# Patient Record
Sex: Male | Born: 1950 | Race: White | Hispanic: No | Marital: Married | Smoking: Never smoker
Health system: Southern US, Community
[De-identification: ages and names within clinical notes are randomized; demographics above are authoritative.]

## PROBLEM LIST (undated history)

## (undated) DIAGNOSIS — E7800 Pure hypercholesterolemia, unspecified: Secondary | ICD-10-CM

## (undated) DIAGNOSIS — I10 Essential (primary) hypertension: Secondary | ICD-10-CM

## (undated) DIAGNOSIS — K219 Gastro-esophageal reflux disease without esophagitis: Secondary | ICD-10-CM

## (undated) DIAGNOSIS — E119 Type 2 diabetes mellitus without complications: Secondary | ICD-10-CM

## (undated) DIAGNOSIS — R0600 Dyspnea, unspecified: Secondary | ICD-10-CM

## (undated) HISTORY — DX: Type 2 diabetes mellitus without complications: E11.9

## (undated) HISTORY — PX: COLONOSCOPY: SHX174

---

## 2015-07-08 DIAGNOSIS — E11319 Type 2 diabetes mellitus with unspecified diabetic retinopathy without macular edema: Secondary | ICD-10-CM | POA: Diagnosis not present

## 2015-07-20 DIAGNOSIS — Z0000 Encounter for general adult medical examination without abnormal findings: Secondary | ICD-10-CM | POA: Diagnosis not present

## 2015-07-20 DIAGNOSIS — E1165 Type 2 diabetes mellitus with hyperglycemia: Secondary | ICD-10-CM | POA: Diagnosis not present

## 2015-07-20 DIAGNOSIS — I10 Essential (primary) hypertension: Secondary | ICD-10-CM | POA: Diagnosis not present

## 2015-07-21 DIAGNOSIS — Z23 Encounter for immunization: Secondary | ICD-10-CM | POA: Diagnosis not present

## 2015-09-23 DIAGNOSIS — E1165 Type 2 diabetes mellitus with hyperglycemia: Secondary | ICD-10-CM | POA: Diagnosis not present

## 2015-09-23 DIAGNOSIS — Z1389 Encounter for screening for other disorder: Secondary | ICD-10-CM | POA: Diagnosis not present

## 2015-09-23 DIAGNOSIS — I10 Essential (primary) hypertension: Secondary | ICD-10-CM | POA: Diagnosis not present

## 2015-09-23 DIAGNOSIS — Z0000 Encounter for general adult medical examination without abnormal findings: Secondary | ICD-10-CM | POA: Diagnosis not present

## 2015-10-27 DIAGNOSIS — M810 Age-related osteoporosis without current pathological fracture: Secondary | ICD-10-CM | POA: Diagnosis not present

## 2015-10-27 DIAGNOSIS — Z79899 Other long term (current) drug therapy: Secondary | ICD-10-CM | POA: Diagnosis not present

## 2015-10-27 DIAGNOSIS — Z7984 Long term (current) use of oral hypoglycemic drugs: Secondary | ICD-10-CM | POA: Diagnosis not present

## 2015-10-27 DIAGNOSIS — I10 Essential (primary) hypertension: Secondary | ICD-10-CM | POA: Diagnosis not present

## 2015-10-27 DIAGNOSIS — E119 Type 2 diabetes mellitus without complications: Secondary | ICD-10-CM | POA: Diagnosis not present

## 2015-10-27 DIAGNOSIS — M8588 Other specified disorders of bone density and structure, other site: Secondary | ICD-10-CM | POA: Diagnosis not present

## 2015-11-23 DIAGNOSIS — R1319 Other dysphagia: Secondary | ICD-10-CM | POA: Diagnosis not present

## 2015-11-23 DIAGNOSIS — I10 Essential (primary) hypertension: Secondary | ICD-10-CM | POA: Diagnosis not present

## 2015-11-23 DIAGNOSIS — E1165 Type 2 diabetes mellitus with hyperglycemia: Secondary | ICD-10-CM | POA: Diagnosis not present

## 2015-12-29 DIAGNOSIS — Z79891 Long term (current) use of opiate analgesic: Secondary | ICD-10-CM | POA: Diagnosis not present

## 2015-12-29 DIAGNOSIS — M545 Low back pain: Secondary | ICD-10-CM | POA: Diagnosis not present

## 2016-01-31 DIAGNOSIS — I10 Essential (primary) hypertension: Secondary | ICD-10-CM | POA: Diagnosis not present

## 2016-01-31 DIAGNOSIS — E1165 Type 2 diabetes mellitus with hyperglycemia: Secondary | ICD-10-CM | POA: Diagnosis not present

## 2016-03-03 DIAGNOSIS — E1165 Type 2 diabetes mellitus with hyperglycemia: Secondary | ICD-10-CM | POA: Diagnosis not present

## 2016-03-03 DIAGNOSIS — I10 Essential (primary) hypertension: Secondary | ICD-10-CM | POA: Diagnosis not present

## 2016-04-03 DIAGNOSIS — E1165 Type 2 diabetes mellitus with hyperglycemia: Secondary | ICD-10-CM | POA: Diagnosis not present

## 2016-04-03 DIAGNOSIS — E784 Other hyperlipidemia: Secondary | ICD-10-CM | POA: Diagnosis not present

## 2016-04-03 DIAGNOSIS — I10 Essential (primary) hypertension: Secondary | ICD-10-CM | POA: Diagnosis not present

## 2016-05-29 DIAGNOSIS — H2512 Age-related nuclear cataract, left eye: Secondary | ICD-10-CM | POA: Diagnosis not present

## 2016-05-29 DIAGNOSIS — H25012 Cortical age-related cataract, left eye: Secondary | ICD-10-CM | POA: Diagnosis not present

## 2016-05-29 DIAGNOSIS — H35031 Hypertensive retinopathy, right eye: Secondary | ICD-10-CM | POA: Diagnosis not present

## 2016-05-29 DIAGNOSIS — Z961 Presence of intraocular lens: Secondary | ICD-10-CM | POA: Diagnosis not present

## 2016-05-29 DIAGNOSIS — E113291 Type 2 diabetes mellitus with mild nonproliferative diabetic retinopathy without macular edema, right eye: Secondary | ICD-10-CM | POA: Diagnosis not present

## 2016-06-02 DIAGNOSIS — E1165 Type 2 diabetes mellitus with hyperglycemia: Secondary | ICD-10-CM | POA: Diagnosis not present

## 2016-06-02 DIAGNOSIS — E784 Other hyperlipidemia: Secondary | ICD-10-CM | POA: Diagnosis not present

## 2016-06-02 DIAGNOSIS — I10 Essential (primary) hypertension: Secondary | ICD-10-CM | POA: Diagnosis not present

## 2016-06-13 DIAGNOSIS — H25012 Cortical age-related cataract, left eye: Secondary | ICD-10-CM | POA: Diagnosis not present

## 2016-06-13 DIAGNOSIS — H2512 Age-related nuclear cataract, left eye: Secondary | ICD-10-CM | POA: Diagnosis not present

## 2016-06-13 DIAGNOSIS — H2522 Age-related cataract, morgagnian type, left eye: Secondary | ICD-10-CM | POA: Diagnosis not present

## 2016-06-15 DIAGNOSIS — I10 Essential (primary) hypertension: Secondary | ICD-10-CM | POA: Diagnosis not present

## 2016-06-15 DIAGNOSIS — E1165 Type 2 diabetes mellitus with hyperglycemia: Secondary | ICD-10-CM | POA: Diagnosis not present

## 2016-08-17 DIAGNOSIS — E1165 Type 2 diabetes mellitus with hyperglycemia: Secondary | ICD-10-CM | POA: Diagnosis not present

## 2016-08-17 DIAGNOSIS — I10 Essential (primary) hypertension: Secondary | ICD-10-CM | POA: Diagnosis not present

## 2016-08-31 DIAGNOSIS — Z1389 Encounter for screening for other disorder: Secondary | ICD-10-CM | POA: Diagnosis not present

## 2016-08-31 DIAGNOSIS — E1165 Type 2 diabetes mellitus with hyperglycemia: Secondary | ICD-10-CM | POA: Diagnosis not present

## 2016-08-31 DIAGNOSIS — I10 Essential (primary) hypertension: Secondary | ICD-10-CM | POA: Diagnosis not present

## 2016-08-31 DIAGNOSIS — Z0000 Encounter for general adult medical examination without abnormal findings: Secondary | ICD-10-CM | POA: Diagnosis not present

## 2016-08-31 DIAGNOSIS — E784 Other hyperlipidemia: Secondary | ICD-10-CM | POA: Diagnosis not present

## 2016-09-14 DIAGNOSIS — I10 Essential (primary) hypertension: Secondary | ICD-10-CM | POA: Diagnosis not present

## 2016-09-14 DIAGNOSIS — E1165 Type 2 diabetes mellitus with hyperglycemia: Secondary | ICD-10-CM | POA: Diagnosis not present

## 2016-10-26 DIAGNOSIS — E1165 Type 2 diabetes mellitus with hyperglycemia: Secondary | ICD-10-CM | POA: Diagnosis not present

## 2016-10-26 DIAGNOSIS — I10 Essential (primary) hypertension: Secondary | ICD-10-CM | POA: Diagnosis not present

## 2016-10-31 DIAGNOSIS — Z0000 Encounter for general adult medical examination without abnormal findings: Secondary | ICD-10-CM | POA: Diagnosis not present

## 2016-10-31 DIAGNOSIS — E1165 Type 2 diabetes mellitus with hyperglycemia: Secondary | ICD-10-CM | POA: Diagnosis not present

## 2016-10-31 DIAGNOSIS — Z125 Encounter for screening for malignant neoplasm of prostate: Secondary | ICD-10-CM | POA: Diagnosis not present

## 2016-10-31 DIAGNOSIS — I10 Essential (primary) hypertension: Secondary | ICD-10-CM | POA: Diagnosis not present

## 2016-10-31 DIAGNOSIS — E784 Other hyperlipidemia: Secondary | ICD-10-CM | POA: Diagnosis not present

## 2016-11-16 DIAGNOSIS — I10 Essential (primary) hypertension: Secondary | ICD-10-CM | POA: Diagnosis not present

## 2016-11-16 DIAGNOSIS — E1165 Type 2 diabetes mellitus with hyperglycemia: Secondary | ICD-10-CM | POA: Diagnosis not present

## 2017-01-01 DIAGNOSIS — I10 Essential (primary) hypertension: Secondary | ICD-10-CM | POA: Diagnosis not present

## 2017-01-01 DIAGNOSIS — E784 Other hyperlipidemia: Secondary | ICD-10-CM | POA: Diagnosis not present

## 2017-01-01 DIAGNOSIS — M545 Low back pain: Secondary | ICD-10-CM | POA: Diagnosis not present

## 2017-01-01 DIAGNOSIS — E1165 Type 2 diabetes mellitus with hyperglycemia: Secondary | ICD-10-CM | POA: Diagnosis not present

## 2017-01-01 DIAGNOSIS — R1319 Other dysphagia: Secondary | ICD-10-CM | POA: Diagnosis not present

## 2017-01-08 ENCOUNTER — Encounter (INDEPENDENT_AMBULATORY_CARE_PROVIDER_SITE_OTHER): Payer: Self-pay | Admitting: Internal Medicine

## 2017-01-10 ENCOUNTER — Other Ambulatory Visit (INDEPENDENT_AMBULATORY_CARE_PROVIDER_SITE_OTHER): Payer: Self-pay | Admitting: Internal Medicine

## 2017-01-10 ENCOUNTER — Encounter (INDEPENDENT_AMBULATORY_CARE_PROVIDER_SITE_OTHER): Payer: Self-pay | Admitting: *Deleted

## 2017-01-10 ENCOUNTER — Ambulatory Visit (INDEPENDENT_AMBULATORY_CARE_PROVIDER_SITE_OTHER): Payer: Medicare Other | Admitting: Internal Medicine

## 2017-01-10 ENCOUNTER — Encounter (INDEPENDENT_AMBULATORY_CARE_PROVIDER_SITE_OTHER): Payer: Self-pay | Admitting: Internal Medicine

## 2017-01-10 ENCOUNTER — Encounter (INDEPENDENT_AMBULATORY_CARE_PROVIDER_SITE_OTHER): Payer: Self-pay

## 2017-01-10 VITALS — BP 140/80 | HR 64 | Temp 97.6°F | Ht 73.0 in | Wt 196.4 lb

## 2017-01-10 DIAGNOSIS — R1319 Other dysphagia: Secondary | ICD-10-CM | POA: Insufficient documentation

## 2017-01-10 DIAGNOSIS — R1310 Dysphagia, unspecified: Secondary | ICD-10-CM

## 2017-01-10 DIAGNOSIS — E119 Type 2 diabetes mellitus without complications: Secondary | ICD-10-CM | POA: Insufficient documentation

## 2017-01-10 HISTORY — DX: Type 2 diabetes mellitus without complications: E11.9

## 2017-01-10 MED ORDER — OMEPRAZOLE 40 MG PO CPDR: 40.0000 mg | DELAYED_RELEASE_CAPSULE | Freq: Every day | ORAL | 3 refills | Status: DC

## 2017-01-10 NOTE — Progress Notes (Signed)
   Subjective:    Patient ID: Dylan Shea, male    DOB: 1951-01-02, 66 y.o.   MRN: [id number]  HPI Referred by Dr. Sherrie Sport for dysphagia. When he drinks or eats he chokes on his food. He coughs frequently.  He has to chew his medicine up to be able to swallow.  Symptoms for a year and progressively worsened. He underwent and EGD/ED about 5 yrs ago by Dr. Britta Mccreedy in [place].  Appetite is good. Has lost weight due to his dysphagia (20 pounds). No abdominal pain. He has a BM daily. Occasionally has diarrhea after he eats.   diabetic since 1996  03/29/2011 Colonoscopy with snare polypectomy: Dr. Britta Mccreedy. Sessile polyp found in the proximal transverse colon. Removed in 2 pieces with snare.  Biopsy Tubular adenoma.   Review of Systems Past Medical History:  Diagnosis Date  . Diabetes ([place]) 01/10/2017    No past surgical history on file.  Not on File  No current outpatient prescriptions on file prior to visit.   No current facility-administered medications on file prior to visit.    Current Outpatient Prescriptions  Medication Sig Dispense Refill  . alendronate (FOSAMAX) 70 MG tablet Take 70 mg by mouth once a week. Take with a full glass of water on an empty stomach.    . empagliflozin (JARDIANCE) 25 MG TABS tablet Take 25 mg by mouth daily.    Marland Kitchen glimepiride (AMARYL) 4 MG tablet Take 4 mg by mouth daily with breakfast.    . metFORMIN (GLUCOPHAGE) 1000 MG tablet Take 1,000 mg by mouth 2 (two) times daily with a meal.    . simvastatin (ZOCOR) 20 MG tablet Take 20 mg by mouth daily.     No current facility-administered medications for this visit.         Objective:   Physical Exam Blood pressure 140/80, pulse 64, temperature 97.6 F (36.4 C), height 6\' 1"  (1.854 m), weight 196 lb 6.4 oz (89.1 kg). Alert and oriented. Skin warm and dry. Oral mucosa is moist.   . Sclera anicteric, conjunctivae is pink. Thyroid not enlarged. No cervical lymphadenopathy. Lungs clear. Heart regular rate  and rhythm.  Abdomen is soft. Bowel sounds are positive. No hepatomegaly. No abdominal masses felt. No tenderness.  No edema to lower extremities.  .         Assessment & Plan:  Dysphagia. EGD/ED. Esophageal stricture needs to be ruled out. Will get last EGD/ED from [facility].  Rx for Omeprazole 40mg  daily.

## 2017-01-10 NOTE — Patient Instructions (Signed)
EGD/ED

## 2017-01-12 ENCOUNTER — Encounter (INDEPENDENT_AMBULATORY_CARE_PROVIDER_SITE_OTHER): Payer: Self-pay

## 2017-01-25 DIAGNOSIS — E1165 Type 2 diabetes mellitus with hyperglycemia: Secondary | ICD-10-CM | POA: Diagnosis not present

## 2017-01-25 DIAGNOSIS — I10 Essential (primary) hypertension: Secondary | ICD-10-CM | POA: Diagnosis not present

## 2017-02-05 ENCOUNTER — Encounter (INDEPENDENT_AMBULATORY_CARE_PROVIDER_SITE_OTHER): Payer: Self-pay

## 2017-02-13 ENCOUNTER — Other Ambulatory Visit: Payer: Self-pay

## 2017-02-13 NOTE — Patient Outreach (Signed)
[facility]) Care Management  02/13/2017  Less Woolsey [birth year] [id number]    Medication Adherence call to  Mr. Jequan Shahin the reason for this call is because she is showing past due under [place] on her jardiance 25 mg patient said she needs the medication and if we can help her order the medication from [facility] call pharmacy and they will have it ready for her.    [place] Management Direct Dial [phone]  Fax [phone] [email or web address]

## 2017-02-14 ENCOUNTER — Other Ambulatory Visit: Payer: Self-pay

## 2017-02-14 NOTE — Patient Outreach (Signed)
[facility]) Care Management  02/14/2017  Dylan Shea 02-16-1951 [id number]   Telephone Screen  Referral Date: 02/13/17 Referral Source: Med Adherence Call Referral Reason: "Diabetes" Insurance: [facility] Medicare   Outreach attempt # 1 to patient. Spoke with patient and screening call completed.   Social: Patient resides in his home along with his spouse. He voices that he is independent with all ADLs/IADLs. He drives himself to medical appts. He denies any recent falls. No assistive devices currently being used.    Conditions: Patient voices that he only has medical issue of diabetes(since 1996). He voices that he is checking his blood sugars every other day. He reports cbgs normally range in the 100s-115s. Patient is scheduled for upcoming EGD to determine cause of his coughing and difficulty swallowing at times.    Medications: Patient voices taking less than 10 meds. Denies any issues with affording meds. He states that his spouse assists him with managing his meds.   Appointments: Patient reports he only sess PCP. Saw last in July and will see again next month.   Consent: [facility] services reviewed and discussed with patient. Patient gave verbal consent for services. Patient voices no RN CM care coordination needs. He is agreeable to [facility] for further diabetes management and education.      Plan: RN CM will notify [facility] administrative assistant of case status. RN CM will send [facility] referral for further diabetes education and management.   [name], RN,BSN,CCM [place] Management Telephonic Care Management Coordinator Direct Phone: [phone] Toll Free: [phone] Fax: [phone]

## 2017-03-12 DIAGNOSIS — E1165 Type 2 diabetes mellitus with hyperglycemia: Secondary | ICD-10-CM | POA: Diagnosis not present

## 2017-03-12 DIAGNOSIS — I10 Essential (primary) hypertension: Secondary | ICD-10-CM | POA: Diagnosis not present

## 2017-03-13 DIAGNOSIS — M545 Low back pain: Secondary | ICD-10-CM | POA: Diagnosis not present

## 2017-03-13 DIAGNOSIS — I10 Essential (primary) hypertension: Secondary | ICD-10-CM | POA: Diagnosis not present

## 2017-03-13 DIAGNOSIS — E1165 Type 2 diabetes mellitus with hyperglycemia: Secondary | ICD-10-CM | POA: Diagnosis not present

## 2017-03-13 DIAGNOSIS — E784 Other hyperlipidemia: Secondary | ICD-10-CM | POA: Diagnosis not present

## 2017-04-26 ENCOUNTER — Ambulatory Visit (HOSPITAL_COMMUNITY)
Admission: RE | Admit: 2017-04-26 | Discharge: 2017-04-26 | Disposition: A | Discharge status: Home / Self Care | Payer: Medicare Other | Source: Ambulatory Visit | Attending: Internal Medicine | Admitting: Internal Medicine

## 2017-04-26 ENCOUNTER — Encounter (HOSPITAL_COMMUNITY)
Admission: RE | Disposition: A | Discharge status: Home / Self Care | Payer: Self-pay | Source: Ambulatory Visit | Attending: Internal Medicine

## 2017-04-26 ENCOUNTER — Encounter (HOSPITAL_COMMUNITY): Payer: Self-pay | Admitting: *Deleted

## 2017-04-26 DIAGNOSIS — K449 Diaphragmatic hernia without obstruction or gangrene: Secondary | ICD-10-CM

## 2017-04-26 DIAGNOSIS — K219 Gastro-esophageal reflux disease without esophagitis: Secondary | ICD-10-CM | POA: Insufficient documentation

## 2017-04-26 DIAGNOSIS — E7800 Pure hypercholesterolemia, unspecified: Secondary | ICD-10-CM | POA: Diagnosis not present

## 2017-04-26 DIAGNOSIS — K228 Other specified diseases of esophagus: Secondary | ICD-10-CM | POA: Diagnosis not present

## 2017-04-26 DIAGNOSIS — Z79899 Other long term (current) drug therapy: Secondary | ICD-10-CM | POA: Diagnosis not present

## 2017-04-26 DIAGNOSIS — E119 Type 2 diabetes mellitus without complications: Secondary | ICD-10-CM | POA: Diagnosis not present

## 2017-04-26 DIAGNOSIS — R1310 Dysphagia, unspecified: Secondary | ICD-10-CM | POA: Insufficient documentation

## 2017-04-26 DIAGNOSIS — Z7984 Long term (current) use of oral hypoglycemic drugs: Secondary | ICD-10-CM | POA: Insufficient documentation

## 2017-04-26 DIAGNOSIS — R1314 Dysphagia, pharyngoesophageal phase: Secondary | ICD-10-CM | POA: Diagnosis not present

## 2017-04-26 DIAGNOSIS — I10 Essential (primary) hypertension: Secondary | ICD-10-CM | POA: Insufficient documentation

## 2017-04-26 DIAGNOSIS — R1319 Other dysphagia: Secondary | ICD-10-CM | POA: Insufficient documentation

## 2017-04-26 HISTORY — DX: Gastro-esophageal reflux disease without esophagitis: K21.9

## 2017-04-26 HISTORY — PX: ESOPHAGEAL DILATION: SHX303

## 2017-04-26 HISTORY — DX: Pure hypercholesterolemia, unspecified: E78.00

## 2017-04-26 HISTORY — DX: Essential (primary) hypertension: I10

## 2017-04-26 HISTORY — PX: ESOPHAGOGASTRODUODENOSCOPY: SHX5428

## 2017-04-26 LAB — GLUCOSE, CAPILLARY: GLUCOSE-CAPILLARY: 129 mg/dL — AB (ref 65–99)

## 2017-04-26 SURGERY — EGD (ESOPHAGOGASTRODUODENOSCOPY)
Anesthesia: Moderate Sedation

## 2017-04-26 MED ORDER — MEPERIDINE HCL 50 MG/ML IJ SOLN
INTRAMUSCULAR | Status: AC
  Filled 2017-04-26: qty 1

## 2017-04-26 MED ORDER — LIDOCAINE VISCOUS 2 % MT SOLN
OROMUCOSAL | Status: DC | PRN
  Administered 2017-04-26: 1 via OROMUCOSAL

## 2017-04-26 MED ORDER — SODIUM CHLORIDE 0.9 % IV SOLN
INTRAVENOUS | Status: DC
  Administered 2017-04-26: 08:00:00 via INTRAVENOUS

## 2017-04-26 MED ORDER — LIDOCAINE VISCOUS 2 % MT SOLN
OROMUCOSAL | Status: AC
  Filled 2017-04-26: qty 15

## 2017-04-26 MED ORDER — MIDAZOLAM HCL 5 MG/5ML IJ SOLN
INTRAMUSCULAR | Status: DC | PRN
  Administered 2017-04-26 (×2): 2 mg via INTRAVENOUS
  Administered 2017-04-26: 1 mg via INTRAVENOUS
  Administered 2017-04-26 (×2): 2 mg via INTRAVENOUS
  Administered 2017-04-26: 1 mg via INTRAVENOUS

## 2017-04-26 MED ORDER — MEPERIDINE HCL 50 MG/ML IJ SOLN
INTRAMUSCULAR | Status: DC | PRN
Start: 2017-04-26 — End: 2017-04-26
  Administered 2017-04-26 (×2): 25 mg via INTRAVENOUS

## 2017-04-26 MED ORDER — MIDAZOLAM HCL 5 MG/5ML IJ SOLN
INTRAMUSCULAR | Status: AC
  Filled 2017-04-26: qty 10

## 2017-04-26 NOTE — Discharge Instructions (Signed)
Resume usual medications and diet as before. No driving for 24 hours. Please call office with progress report in one week.   Upper Endoscopy, Care After Refer to this sheet in the next few weeks. These instructions provide you with information about caring for yourself after your procedure. Your health care provider may also give you more specific instructions. Your treatment has been planned according to current medical practices, but problems sometimes occur. Call your health care provider if you have any problems or questions after your procedure. What can I expect after the procedure? After the procedure, it is common to have:  A sore throat.  Bloating.  Nausea.  Follow these instructions at home:  Follow instructions from your health care provider about what to eat or drink after your procedure.  Return to your normal activities as told by your health care provider. Ask your health care provider what activities are safe for you.  Take over-the-counter and prescription medicines only as told by your health care provider.  Do not drive for 24 hours if you received a sedative.  Keep all follow-up visits as told by your health care provider. This is important. Contact a health care provider if:  You have a sore throat that lasts longer than one day.  You have trouble swallowing. Get help right away if:  You have a fever.  You vomit blood or your vomit looks like coffee grounds.  You have bloody, black, or tarry stools.  You have a severe sore throat or you cannot swallow.  You have difficulty breathing.  You have severe pain in your chest or belly. This information is not intended to replace advice given to you by your health care provider. Make sure you discuss any questions you have with your health care provider. Document Released: 12/19/2011 Document Revised: 11/25/2015 Document Reviewed: 04/01/2015 Elsevier Interactive Patient Education  2017 [employer].

## 2017-04-26 NOTE — H&P (Signed)
Dylan Shea is an 66 y.o. male.   Chief Complaint: Patient is here for EGD and ED. HPI: Patient is 66 year old Caucasian male with chronic GERD and presents with several month history of solid food dysphagia. He states dysphagia started several range years ago. EGD with dilation by Dr. Britta Mccreedy in October 2012. He states it helped for several months. He points to suprasternal area soft bolus obstruction. He says heartburns well controlled. He's had multiple episodes of food impaction relieved with regurgitation. About a year ago his daughter who is not in performed Heimlich maneuver for relief. States he has lost few pounds which he believes is due to dysphagia.  Past Medical History:  Diagnosis Date  . Diabetes ([place]) 01/10/2017  . GERD (gastroesophageal reflux disease)   . Hypercholesteremia   . Hypertension     Past Surgical History:  Procedure Laterality Date  . COLONOSCOPY      History reviewed. No pertinent family history. Social History:  reports that he has never smoked. He has never used smokeless tobacco. He reports that he does not drink alcohol or use drugs.  Allergies: No Known Allergies  Medications Prior to Admission  Medication Sig Dispense Refill  . empagliflozin (JARDIANCE) 25 MG TABS tablet Take 25 mg by mouth daily.    Marland Kitchen glimepiride (AMARYL) 4 MG tablet Take 4 mg by mouth daily with breakfast.    . metFORMIN (GLUCOPHAGE) 1000 MG tablet Take 1,000 mg by mouth 2 (two) times daily with a meal.    . omeprazole (PRILOSEC) 40 MG capsule Take 1 capsule (40 mg total) by mouth daily. 30 capsule 3  . oxyCODONE-acetaminophen (PERCOCET/ROXICET) 5-325 MG tablet Take 1 tablet by mouth 3 (three) times daily as needed for moderate pain.   0  . quinapril-hydrochlorothiazide (ACCURETIC) 20-12.5 MG tablet Take 1 tablet by mouth daily.  0  . simvastatin (ZOCOR) 20 MG tablet Take 20 mg by mouth daily.      Results for orders placed or performed during the hospital encounter of  04/26/17 (from the past 48 hour(s))  Glucose, capillary     Status: Abnormal   Collection Time: 04/26/17  7:45 AM  Result Value Ref Range   Glucose-Capillary 129 (H) 65 - 99 mg/dL   No results found.  ROS  Blood pressure (!) 164/95, pulse 62, temperature 97.8 F (36.6 C), temperature source Oral, resp. rate 15, height 6\' 1"  (1.854 m), weight 200 lb (90.7 kg), SpO2 100 %. Physical Exam  Constitutional: He appears well-developed and well-nourished.  HENT:  Mouth/Throat: Oropharynx is clear and moist.  Eyes: Conjunctivae are normal. No scleral icterus.  Neck: No thyromegaly present.  Cardiovascular: Normal rate, regular rhythm and normal heart sounds.   No murmur heard. Respiratory: Effort normal and breath sounds normal.  GI: Soft. He exhibits no distension and no mass. There is no tenderness.  Musculoskeletal: He exhibits no edema.  Lymphadenopathy:    He has no cervical adenopathy.  Neurological: He is alert.  Skin: Skin is warm and dry.     Assessment/Plan Solid food dysphagia in patient with chronic GERD. EGD with ED.  Hildred Laser, MD 04/26/2017, 8:57 AM

## 2017-04-26 NOTE — Op Note (Signed)
[facility] Patient Name: Dylan Shea Procedure Date: 04/26/2017 8:47 AM MRN: [id number] Date of Birth: 1950/08/21 Attending MD: Hildred Laser , MD CSN: [id number] Age: 66 Admit Type: Outpatient Procedure:                Upper GI endoscopy Indications:              Esophageal dysphagia, Gastro-esophageal reflux                            disease Providers:                Hildred Laser, MD, Lurline Del, RN, Zoila Shutter,                            Technologist Referring MD:             Stoney Bang MD, MD Medicines:                Lidocaine spray, Meperidine 50 mg IV, Midazolam 10                            mg IV Complications:            No immediate complications. Estimated Blood Loss:     Estimated blood loss: none. Procedure:                Pre-Anesthesia Assessment:                           - Prior to the procedure, a History and Physical                            was performed, and patient medications and                            allergies were reviewed. The patient's tolerance of                            previous anesthesia was also reviewed. The risks                            and benefits of the procedure and the sedation                            options and risks were discussed with the patient.                            All questions were answered, and informed consent                            was obtained. Prior Anticoagulants: The patient has                            taken no previous anticoagulant or antiplatelet                            agents. ASA Grade  Assessment: II - A patient with                            mild systemic disease. After reviewing the risks                            and benefits, the patient was deemed in                            satisfactory condition to undergo the procedure.                           After obtaining informed consent, the endoscope was                            passed under direct vision. Throughout the                             procedure, the patient's blood pressure, pulse, and                            oxygen saturations were monitored continuously. The                            [id number]-[id number] ([id number]) scope was introduced through the                            mouth, and advanced to the second part of duodenum.                            The upper GI endoscopy was accomplished without                            difficulty. The patient tolerated the procedure                            well. Scope In: 9:07:08 AM Scope Out: 9:19:54 AM Total Procedure Duration: 0 hours 12 minutes 46 seconds  Findings:      The examined esophagus was normal.      The Z-line was irregular and was found 42 cm from the incisors.      A 2 cm hiatal hernia was present.      No endoscopic abnormality was evident in the esophagus to explain the       patient's complaint of dysphagia. It was decided, however, to proceed       with dilation of the entire esophagus. The scope was withdrawn. Dilation       was performed with a Maloney dilator with no resistance at 56 Fr. The       dilation site was examined following endoscope reinsertion and showed no       change and no bleeding, mucosal tear or perforation.      The entire examined stomach was normal.      The duodenal bulb and second portion of the duodenum were normal. Impression:               -  Normal esophagus.                           - Z-line irregular, 42 cm from the incisors.                           - 2 cm hiatal hernia.                           - No endoscopic esophageal abnormality to explain                            patient's dysphagia. Esophagus dilated. Dilated.                           - Normal stomach.                           - Normal duodenal bulb and second portion of the                            duodenum.                           - No specimens collected. Moderate Sedation:      Moderate (conscious) sedation was administered by  the endoscopy nurse       and supervised by the endoscopist. The following parameters were       monitored: oxygen saturation, heart rate, blood pressure, CO2       capnography and response to care. Total physician intraservice time was       17 minutes. Recommendation:           - Patient has a contact number available for                            emergencies. The signs and symptoms of potential                            delayed complications were discussed with the                            patient. Return to normal activities tomorrow.                            Written discharge instructions were provided to the                            patient.                           - Resume previous diet today.                           - Continue present medications.                           - Telephone GI clinic in 1 week. Procedure  Code(s):        --- Professional ---                           [phone], Esophagogastroduodenoscopy, flexible,                            transoral; diagnostic, including collection of                            specimen(s) by brushing or washing, when performed                            (separate procedure)                           43450, Dilation of esophagus, by unguided sound or                            bougie, single or multiple passes                           99152, Moderate sedation services provided by the                            same physician or other qualified health care                            professional performing the diagnostic or                            therapeutic service that the sedation supports,                            requiring the presence of an independent trained                            observer to assist in the monitoring of the                            patient's level of consciousness and physiological                            status; initial 15 minutes of intraservice time,                             patient age 86 years or older Diagnosis Code(s):        --- Professional ---                           K22.8, Other specified diseases of esophagus                           K44.9, Diaphragmatic hernia without obstruction or  gangrene                           R13.14, Dysphagia, pharyngoesophageal phase                           K21.9, Gastro-esophageal reflux disease without                            esophagitis CPT copyright 2016 American Medical Association. All rights reserved. The codes documented in this report are preliminary and upon coder review may  be revised to meet current compliance requirements. Hildred Laser, MD Hildred Laser, MD 04/26/2017 9:28:29 AM This report has been signed electronically. Number of Addenda: 0

## 2017-04-30 ENCOUNTER — Encounter (HOSPITAL_COMMUNITY): Payer: Self-pay | Admitting: Internal Medicine

## 2017-04-30 DIAGNOSIS — E1165 Type 2 diabetes mellitus with hyperglycemia: Secondary | ICD-10-CM | POA: Diagnosis not present

## 2017-04-30 DIAGNOSIS — M545 Low back pain: Secondary | ICD-10-CM | POA: Diagnosis not present

## 2017-04-30 DIAGNOSIS — I10 Essential (primary) hypertension: Secondary | ICD-10-CM | POA: Diagnosis not present

## 2017-04-30 DIAGNOSIS — E7849 Other hyperlipidemia: Secondary | ICD-10-CM | POA: Diagnosis not present

## 2017-05-01 ENCOUNTER — Other Ambulatory Visit: Payer: Self-pay | Admitting: *Deleted

## 2017-05-01 NOTE — Patient Outreach (Signed)
[facility]) Care Management  05/01/2017   Dylan Shea 1951-02-04 [id number]  RN Health Coach telephone call to patient.  [place] compliance verified. Per patient he is doing well. His fasting blood sugar was 129 yesterday. Per patient he checks it every other day. Patient stated he has had low blood sugar episodes that probably occurs maybe every 3 months. Per patient he feels nervous and jittery when this happens. Per patient he eats some candy. Patient doesn't have symptoms when blood sugar is elevated that he knows of.  Patient stated that his wife keeps  up with his blood sugar. . Per patient he has not had any falls. He is independent and goes out on his boat and still drives. Patient is able to get to his Dr appointments. Per patient he normally does not eat breakfast. Patient stated he eats no cookies or sweets like that. Patient has agreed to follow up outreach calls.  Current Medications:  Current Outpatient Prescriptions  Medication Sig Dispense Refill  . empagliflozin (JARDIANCE) 25 MG TABS tablet Take 25 mg by mouth daily.    Marland Kitchen glimepiride (AMARYL) 4 MG tablet Take 4 mg by mouth daily with breakfast.    . metFORMIN (GLUCOPHAGE) 1000 MG tablet Take 1,000 mg by mouth 2 (two) times daily with a meal.    . omeprazole (PRILOSEC) 40 MG capsule Take 1 capsule (40 mg total) by mouth daily. 30 capsule 3  . oxyCODONE-acetaminophen (PERCOCET/ROXICET) 5-325 MG tablet Take 1 tablet by mouth 3 (three) times daily as needed for moderate pain.   0  . quinapril-hydrochlorothiazide (ACCURETIC) 20-12.5 MG tablet Take 1 tablet by mouth daily.  0  . simvastatin (ZOCOR) 20 MG tablet Take 20 mg by mouth daily.     No current facility-administered medications for this visit.     Functional Status:  In your present state of health, do you have any difficulty performing the following activities: 05/01/2017  Hearing? N  Vision? N  Difficulty concentrating or making decisions? N   Walking or climbing stairs? N  Dressing or bathing? N  Preparing Food and eating ? N  Using the Toilet? N  In the past six months, have you accidently leaked urine? N  Do you have problems with loss of bowel control? N  Managing your Medications? N  Managing your Finances? N  Housekeeping or managing your Housekeeping? N  Some recent data might be hidden    Fall/Depression Screening: Fall Risk  05/01/2017 02/14/2017  Falls in the past year? No No   PHQ 2/9 Scores 05/01/2017 02/14/2017  PHQ - 2 Score 0 0   THN CM Care Plan Problem One     Most Recent Value  Care Plan Problem One  Knowledge Deficit in self management of diabetes  Role Documenting the Problem One  [place] for Problem One  Active  THN Long Term Goal   Patient will have a better knowledge understanding of diabetes care within the next 90 days  THN Long Term Goal Start Date  05/01/17  Interventions for Problem One Long Term Goal  RN sent patient living well with diabetes booklet. RN discussed A1C. RN discussed hypo and hyperglycemia. RN will follow up with discussion and teachback.  THN CM Short Term Goal #1   Patient will understand the signs and symptoms of hypo and hyperglycemia within the next 30 days  THN CM Short Term Goal #1 Start Date  05/01/17  Interventions for Short Term Goal #1  RN sent picture chart of hypo and hyperglycemia. RN will follow up with discussion and teachback  THN CM Short Term Goal #2   Patient will report documenting blood sugars within the next 30 days  THN CM Short Term Goal #2 Start Date  05/01/17  Interventions for Short Term Goal #2  RN sent patient an 43 month calendar book for documentation and recording of blood sugars and signs and symptoms when hypo or hyperglycemia occurs. RN will follow up with  discussion and teach back.       Assessment:  Patient fasting blood sugar yesterday was 129 Patient checks blood sugars every other day Patient know his symptoms of  hypoglycemia but none noted for hyperglycemia Patient has not had any recent falls Patient will benefit from [facility] telephonic outreach for education and support for diabetes self management.   Plan: RN sent living well with Diabetes book RN sent a calendar book for documentation RN discussed hypo and hyperglycemia symptoms RN discussed A1C RN will follow up within the month of November RN sent a barriers letter and assessment to [facility] [phone]

## 2017-05-03 DIAGNOSIS — E1165 Type 2 diabetes mellitus with hyperglycemia: Secondary | ICD-10-CM | POA: Diagnosis not present

## 2017-05-03 DIAGNOSIS — M545 Low back pain: Secondary | ICD-10-CM | POA: Diagnosis not present

## 2017-05-03 DIAGNOSIS — I10 Essential (primary) hypertension: Secondary | ICD-10-CM | POA: Diagnosis not present

## 2017-05-03 DIAGNOSIS — E7849 Other hyperlipidemia: Secondary | ICD-10-CM | POA: Diagnosis not present

## 2017-05-09 ENCOUNTER — Telehealth (INDEPENDENT_AMBULATORY_CARE_PROVIDER_SITE_OTHER): Payer: Self-pay | Admitting: Internal Medicine

## 2017-05-09 NOTE — Telephone Encounter (Signed)
Patient presented to the office today with a progress report.  The patient states that he is a little better, but not a lot.  [phone] - spouse Rosann Auerbach

## 2017-05-14 DIAGNOSIS — E1165 Type 2 diabetes mellitus with hyperglycemia: Secondary | ICD-10-CM | POA: Diagnosis not present

## 2017-05-14 DIAGNOSIS — M545 Low back pain: Secondary | ICD-10-CM | POA: Diagnosis not present

## 2017-05-14 DIAGNOSIS — I10 Essential (primary) hypertension: Secondary | ICD-10-CM | POA: Diagnosis not present

## 2017-05-14 DIAGNOSIS — E7849 Other hyperlipidemia: Secondary | ICD-10-CM | POA: Diagnosis not present

## 2017-05-16 DIAGNOSIS — M545 Low back pain: Secondary | ICD-10-CM | POA: Diagnosis not present

## 2017-05-16 DIAGNOSIS — E1165 Type 2 diabetes mellitus with hyperglycemia: Secondary | ICD-10-CM | POA: Diagnosis not present

## 2017-05-16 DIAGNOSIS — I10 Essential (primary) hypertension: Secondary | ICD-10-CM | POA: Diagnosis not present

## 2017-05-16 DIAGNOSIS — E7849 Other hyperlipidemia: Secondary | ICD-10-CM | POA: Diagnosis not present

## 2017-05-18 NOTE — Telephone Encounter (Signed)
Per Dr.Rehman the patient will need to have a Barium Swallow with Pill.

## 2017-05-18 NOTE — Telephone Encounter (Signed)
This was reviewed with Dr.Rehman.

## 2017-05-21 ENCOUNTER — Other Ambulatory Visit (INDEPENDENT_AMBULATORY_CARE_PROVIDER_SITE_OTHER): Payer: Self-pay | Admitting: Internal Medicine

## 2017-05-21 DIAGNOSIS — R1310 Dysphagia, unspecified: Secondary | ICD-10-CM

## 2017-05-21 NOTE — Telephone Encounter (Signed)
BPE sch'd 05/28/17 at 930 (915), npo 4 hrs prior --  Left detailed message for patient's wife Rosann Auerbach

## 2017-05-28 ENCOUNTER — Ambulatory Visit (HOSPITAL_COMMUNITY): Payer: Medicare Other

## 2017-05-28 ENCOUNTER — Other Ambulatory Visit: Payer: Self-pay | Admitting: *Deleted

## 2017-05-28 NOTE — Patient Outreach (Signed)
Dylan Shea) Care Management  05/28/2017   Dylan Shea 1950-08-06 [id number]  RN Health Coach telephone call to patient.  Hipaa compliance verified. Per patient his fasting blood sugar was 127. Patient is taking his medications as prescribed. Patient is trying to adhere to his diet. Patient reported receiving the educational material RN Health Coach sent. RN went over the the symptoms of heart attack and stroke signs with patient and the fast action plan. Patient stated he had an eye exam done by the [employer] that came to his house. Patient has agreed to follow up outreach calls.      Current Medications:  Current Outpatient Medications  Medication Sig Dispense Refill  . empagliflozin (JARDIANCE) 25 MG TABS tablet Take 25 mg by mouth daily.    Dylan Shea glimepiride (AMARYL) 4 MG tablet Take 4 mg by mouth daily with breakfast.    . metFORMIN (GLUCOPHAGE) 1000 MG tablet Take 1,000 mg by mouth 2 (two) times daily with a meal.    . omeprazole (PRILOSEC) 40 MG capsule Take 1 capsule (40 mg total) by mouth daily. 30 capsule 3  . oxyCODONE-acetaminophen (PERCOCET/ROXICET) 5-325 MG tablet Take 1 tablet by mouth 3 (three) times daily as needed for moderate pain.   0  . quinapril-hydrochlorothiazide (ACCURETIC) 20-12.5 MG tablet Take 1 tablet by mouth daily.  0  . simvastatin (ZOCOR) 20 MG tablet Take 20 mg by mouth daily.     No current facility-administered medications for this visit.     Functional Status:  In your present state of health, do you have any difficulty performing the following activities: 05/28/2017 05/01/2017  Hearing? N N  Vision? N N  Difficulty concentrating or making decisions? N N  Walking or climbing stairs? N N  Dressing or bathing? N N  Doing errands, shopping? N -  Preparing Food and eating ? N N  Using the Toilet? N N  In the past six months, have you accidently leaked urine? N N  Do you have problems with loss of bowel  control? N N  Managing your Medications? N N  Managing your Finances? N N  Housekeeping or managing your Housekeeping? N N  Some recent data might be hidden    Fall/Depression Screening: Fall Risk  05/28/2017 05/01/2017 02/14/2017  Falls in the past year? No No No   PHQ 2/9 Scores 05/28/2017 05/01/2017 02/14/2017  PHQ - 2 Score 0 0 0   THN CM Care Plan Problem One     Most Recent Value  Care Plan Problem One  Knowledge Deficit in self management of diabetes  Role Documenting the Problem One  [place] for Problem One  Active  THN Long Term Goal   Patient will have a better knowledge understanding of diabetes care within the next 90 days  THN Long Term Goal Start Date  05/28/17  Interventions for Problem One Long Term Goal  RN sent patient living well with diabetes booklet. RN discussed A1C. RN discussed hypo and hyperglycemia. RN will follow up with discussion and teachback.  THN CM Short Term Goal #1   Patient will understand the signs and symptoms of hypo and hyperglycemia within the next 30 days  THN CM Short Term Goal #1 Start Date  05/28/17  Interventions for Short Term Goal #1  RN sent picture chart of hypo and hyperglycemia. RN will follow up with discussion and teachback  THN CM Short Term Goal #2   Patient will report  documenting blood sugars within the next 30 days  THN CM Short Term Goal #2 Start Date  05/28/17  Interventions for Short Term Goal #2  RN sent patient an 42 month calendar book for documentation and recording of blood sugars and signs and symptoms when hypo or hyperglycemia occurs. RN will follow up with  discussion and teach back.   THN CM Short Term Goal #3  Patient will be able to verbalize signs and symptoms of the fly, pneumonia and the common cold within the next 30 days  THN CM Short Term Goal #3 Start Date  05/28/17  Interventions for Short Tern Goal #3  RN sent EMMI educational material on the common cold, community acquired pneumonia, and  the flu. RN will follow up with further discussion and teach back  THN CM Short Term Goal #4  Patient will be able to verbalize what to do on a diabetic sick day within the next 30 days  THN CM Short Term Goal #4 Start Date  05/28/17  Interventions for Short Term Goal #4  RN discussed diabetic sick days. RN will send EMMI educational material on diabetic sick days. RN will follow up with discussion and teach back.       Assessment:  Fasting blood sugar was 127 Patient is trying to adhere to diet Patient received flu shot Patient will continue to  benefit from [place] telephonic outreach for education and support for diabetes self management.  Plan:  RN sent EMMI educational material on cold, flu and pneumonia RN sent Educational material on Diabetic sick days RN discussed signs and symptoms of heart attack and stoke and FAST action plan [place] will follow up within the month of December  Dylan Shea Care Management [phone]

## 2017-06-28 ENCOUNTER — Ambulatory Visit: Payer: Self-pay | Admitting: *Deleted

## 2017-06-28 ENCOUNTER — Other Ambulatory Visit: Payer: Self-pay | Admitting: *Deleted

## 2017-06-28 NOTE — Patient Outreach (Signed)
[place] [facility]) Care Management  06/28/2017  Dylan Shea 04-Oct-1950 [id number]   [place] attempted #62follow up outreach call to patient.  Patient was unavailable. HIPPA compliance voicemail message left with return callback number.  Plan: RN will call patient again within 14 days.  [place] Care Management [phone]

## 2017-07-05 DIAGNOSIS — E7849 Other hyperlipidemia: Secondary | ICD-10-CM | POA: Diagnosis not present

## 2017-07-05 DIAGNOSIS — E1165 Type 2 diabetes mellitus with hyperglycemia: Secondary | ICD-10-CM | POA: Diagnosis not present

## 2017-07-05 DIAGNOSIS — M545 Low back pain: Secondary | ICD-10-CM | POA: Diagnosis not present

## 2017-07-05 DIAGNOSIS — I10 Essential (primary) hypertension: Secondary | ICD-10-CM | POA: Diagnosis not present

## 2017-07-11 ENCOUNTER — Ambulatory Visit: Payer: Self-pay | Admitting: *Deleted

## 2017-07-11 DIAGNOSIS — Z1389 Encounter for screening for other disorder: Secondary | ICD-10-CM | POA: Diagnosis not present

## 2017-07-11 DIAGNOSIS — Z0000 Encounter for general adult medical examination without abnormal findings: Secondary | ICD-10-CM | POA: Diagnosis not present

## 2017-07-11 DIAGNOSIS — I10 Essential (primary) hypertension: Secondary | ICD-10-CM | POA: Diagnosis not present

## 2017-07-11 DIAGNOSIS — E7849 Other hyperlipidemia: Secondary | ICD-10-CM | POA: Diagnosis not present

## 2017-07-11 DIAGNOSIS — E1165 Type 2 diabetes mellitus with hyperglycemia: Secondary | ICD-10-CM | POA: Diagnosis not present

## 2017-07-11 DIAGNOSIS — M545 Low back pain: Secondary | ICD-10-CM | POA: Diagnosis not present

## 2017-08-03 ENCOUNTER — Other Ambulatory Visit: Payer: Self-pay | Admitting: *Deleted

## 2017-08-03 NOTE — Patient Outreach (Signed)
[facility]) Care Management  08/03/2017   Dylan Shea 12-03-1950 [id number]  Subjective: RN Health Coach telephone call to patient.  Hipaa compliance verified. Per patient he is doing good.  Patient blood sugar was 129 yesterday. He does not take his blood sugar every day. Per patient wife documents and keeps up with the patient blood sugars. His voice is hoarse. Per patient he does have a slight hacking cough. Per patient he has gastric reflux but it is not any better with the medications. RN informed patient he needs to talk with his physician if it is not better. Patient is not in a structured exercise program but states he is always on the go.  Patient has agreed to follow up outreach calls.    Current Medications:  Current Outpatient Medications  Medication Sig Dispense Refill  . empagliflozin (JARDIANCE) 25 MG TABS tablet Take 25 mg by mouth daily.    Marland Kitchen glimepiride (AMARYL) 4 MG tablet Take 4 mg by mouth daily with breakfast.    . metFORMIN (GLUCOPHAGE) 1000 MG tablet Take 1,000 mg by mouth 2 (two) times daily with a meal.    . omeprazole (PRILOSEC) 40 MG capsule Take 1 capsule (40 mg total) by mouth daily. 30 capsule 3  . oxyCODONE-acetaminophen (PERCOCET/ROXICET) 5-325 MG tablet Take 1 tablet by mouth 3 (three) times daily as needed for moderate pain.   0  . quinapril-hydrochlorothiazide (ACCURETIC) 20-12.5 MG tablet Take 1 tablet by mouth daily.  0  . simvastatin (ZOCOR) 20 MG tablet Take 20 mg by mouth daily.     No current facility-administered medications for this visit.     Functional Status:  In your present state of health, do you have any difficulty performing the following activities: 08/03/2017 05/28/2017  Hearing? N N  Vision? N N  Difficulty concentrating or making decisions? N N  Walking or climbing stairs? N N  Dressing or bathing? N N  Doing errands, shopping? N N  Preparing Food and eating ? N N  Using the Toilet? N N  In the past six  months, have you accidently leaked urine? N N  Do you have problems with loss of bowel control? N N  Managing your Medications? N N  Managing your Finances? N N  Housekeeping or managing your Housekeeping? N N  Some recent data might be hidden    Fall/Depression Screening: Fall Risk  08/03/2017 05/28/2017 05/01/2017  Falls in the past year? No No No   PHQ 2/9 Scores 08/03/2017 05/28/2017 05/01/2017 02/14/2017  PHQ - 2 Score 0 0 0 0   THN CM Care Plan Problem One     Most Recent Value  Care Plan Problem One  Knowledge Deficit in self management of diabetes  Role Documenting the Problem One  [place] for Problem One  Active  THN Long Term Goal   Patient will have a better knowledge understanding of diabetes care within the next 90 days  Interventions for Problem One Long Term Goal  RN sent Emmi educational material on Diabetic care monthy and throughout the year. RN discussed health Maintenance and follow up appointments. RN sent patient living well with diabetes booklet. RN discussed A1C. RN discussed hypo and hyperglycemia. RN will follow up with discussion and teachback.  THN CM Short Term Goal #1   Patient will understand the signs and symptoms of hypo and hyperglycemia within the next 30 days  THN CM Short Term Goal #1 Met Date  08/03/17  Interventions for Short Term Goal #1  RN sent picture chart of hypo and hyperglycemia. RN will follow up with discussion and teachback  THN CM Short Term Goal #2   Patient will report documenting blood sugars within the next 30 days  THN CM Short Term Goal #2 Start Date  08/03/17  Interventions for Short Term Goal #2  Rn discussed with patient about checking blood sugars. Patient checks blood sugars every other day. Patient voiced understanding of keeping record. Patient wife keeps up with the readings. Patient has the calendar for wife to document readings. RN will follow up with next outreach   [facility] CM Short Term Goal #3  Patient will be  able to verbalize signs and symptoms of the flu, pneumonia and the common cold within the next 30 days  Interventions for Short Tern Goal #3  RN sent EMMI educational material on the common cold, community acquired pneumonia, and the flu. RN will follow up with further discussion and teach back  THN CM Short Term Goal #4  Patient will be able to verbalize what to do on a diabetic sick day within the next 30 days  THN CM Short Term Goal #4 Met Date  08/03/17  Interventions for Short Term Goal #4  RN discussed diabetic sick days. RN will send EMMI educational material on diabetic sick days. RN will follow up with discussion and teach back.  THN CM Short Term Goal #5   Patient will have a better understanding of how diabetes affects the body within the next 30 days  THN CM Short Term Goal #5 Start Date  08/30/17  Interventions for Short Term Goal #5  RN discussed with patient about hypertension. RN sent Emmi educational material on Diabetes and hypertension, Diabetes and heart disease,. RN will follow up with further discussion and teachback.       Assessment:  Fasting blood sugar 129 Patient checks blood sugars every other day No hypo or hyperglycemic reactions Patient is hoarse, chronic hacking cough and throat sore once a week Patient will benefit from [place] telephonic outreach for education and support for diabetes self management. Plan: RN discussed follow health Care maintenance  RN sent EMMI educational material on Diabetes and  blood pressure RN sent EMMI educational material on Heart disease and diabetes RN sent EMMI educational material on  Taking care of yourself day to day RN sent EMMI educational material on  Taking care of yourself year to year Patient will follow up with gastric reflux symptoms RN sent educational material on gastric reflux disease RN will follow up within the month of February  Jaishon Krisher [place] BSN RN [place]  Management [phone]

## 2017-08-16 DIAGNOSIS — M545 Low back pain: Secondary | ICD-10-CM | POA: Diagnosis not present

## 2017-08-16 DIAGNOSIS — E1165 Type 2 diabetes mellitus with hyperglycemia: Secondary | ICD-10-CM | POA: Diagnosis not present

## 2017-08-16 DIAGNOSIS — E7849 Other hyperlipidemia: Secondary | ICD-10-CM | POA: Diagnosis not present

## 2017-08-16 DIAGNOSIS — I10 Essential (primary) hypertension: Secondary | ICD-10-CM | POA: Diagnosis not present

## 2017-08-30 ENCOUNTER — Other Ambulatory Visit: Payer: Self-pay | Admitting: *Deleted

## 2017-08-30 NOTE — Patient Outreach (Signed)
[place] [facility]) Care Management  08/30/2017  Dylan Shea 09/02/1950 [id number]  [place] attempted follow up outreach call to patient.  Patient was unavailable. Per voice mail the mail box is full at this time.  Plan: RN will call patient again within 10 business days  [place] Management [phone]

## 2017-08-31 ENCOUNTER — Ambulatory Visit: Payer: Self-pay | Admitting: *Deleted

## 2017-09-04 DIAGNOSIS — I10 Essential (primary) hypertension: Secondary | ICD-10-CM | POA: Diagnosis not present

## 2017-09-04 DIAGNOSIS — E1165 Type 2 diabetes mellitus with hyperglycemia: Secondary | ICD-10-CM | POA: Diagnosis not present

## 2017-09-04 DIAGNOSIS — M545 Low back pain: Secondary | ICD-10-CM | POA: Diagnosis not present

## 2017-09-04 DIAGNOSIS — E7849 Other hyperlipidemia: Secondary | ICD-10-CM | POA: Diagnosis not present

## 2017-09-17 DIAGNOSIS — E1165 Type 2 diabetes mellitus with hyperglycemia: Secondary | ICD-10-CM | POA: Diagnosis not present

## 2017-09-17 DIAGNOSIS — M545 Low back pain: Secondary | ICD-10-CM | POA: Diagnosis not present

## 2017-09-17 DIAGNOSIS — E7849 Other hyperlipidemia: Secondary | ICD-10-CM | POA: Diagnosis not present

## 2017-09-17 DIAGNOSIS — I10 Essential (primary) hypertension: Secondary | ICD-10-CM | POA: Diagnosis not present

## 2017-11-13 DIAGNOSIS — E7849 Other hyperlipidemia: Secondary | ICD-10-CM | POA: Diagnosis not present

## 2017-11-13 DIAGNOSIS — M545 Low back pain: Secondary | ICD-10-CM | POA: Diagnosis not present

## 2017-11-13 DIAGNOSIS — I10 Essential (primary) hypertension: Secondary | ICD-10-CM | POA: Diagnosis not present

## 2017-11-13 DIAGNOSIS — E1165 Type 2 diabetes mellitus with hyperglycemia: Secondary | ICD-10-CM | POA: Diagnosis not present

## 2017-11-15 DIAGNOSIS — M545 Low back pain: Secondary | ICD-10-CM | POA: Diagnosis not present

## 2017-11-15 DIAGNOSIS — E7849 Other hyperlipidemia: Secondary | ICD-10-CM | POA: Diagnosis not present

## 2017-11-15 DIAGNOSIS — Z0000 Encounter for general adult medical examination without abnormal findings: Secondary | ICD-10-CM | POA: Diagnosis not present

## 2017-11-15 DIAGNOSIS — E1165 Type 2 diabetes mellitus with hyperglycemia: Secondary | ICD-10-CM | POA: Diagnosis not present

## 2017-11-15 DIAGNOSIS — I10 Essential (primary) hypertension: Secondary | ICD-10-CM | POA: Diagnosis not present

## 2017-12-07 DIAGNOSIS — I10 Essential (primary) hypertension: Secondary | ICD-10-CM | POA: Diagnosis not present

## 2017-12-07 DIAGNOSIS — E1165 Type 2 diabetes mellitus with hyperglycemia: Secondary | ICD-10-CM | POA: Diagnosis not present

## 2017-12-11 ENCOUNTER — Other Ambulatory Visit: Payer: Self-pay | Admitting: *Deleted

## 2017-12-11 NOTE — Patient Outreach (Signed)
[place] [facility]) Care Management  12/11/2017  Dylan Shea 26-Mar-1951 [id number]   [place] attempted#1 follow up outreach call to patient.  Patient was unavailable. No voicemail message left. Mailbox is full.  Plan: RN will call patient again within 10 business days.  [place] Care Management [phone]

## 2017-12-11 NOTE — Patient Outreach (Signed)
[place] [facility]) Care Management  12/11/2017   Dylan Shea [birth year] [id number]  [place] received return telephone call from patient.  Hipaa compliance verified. Per patient he had not been checking his blood sugars daily. Patient stated that it was 2 day ago since last checked. Per patient he had not been taking his oral medications due to the difficulty and pain swallowing. Per patient he was taking them in applesauce when he did. Per patient he is having a lot of difficulty swallowing his medications. He stated that he sometimes doesn't take his oral diabetic medication and only takes the shot at night due to the pain. Per patient he eats his food slowly. RN discussed with the patient about using the small mini handy chopper.  Patient knows he is on insulin but not sure what A1C is. Patient had agreed to have the wife call and talk to nurse since she is the caregiver. RN has agreed for further outreach calls.  Current Medications:  Current Outpatient Medications  Medication Sig Dispense Refill  . empagliflozin (JARDIANCE) 25 MG TABS tablet Take 25 mg by mouth daily.    Marland Kitchen glimepiride (AMARYL) 4 MG tablet Take 4 mg by mouth daily with breakfast.    . insulin detemir (LEVEMIR) 100 UNIT/ML injection Inject 20 Units into the skin at bedtime.    . metFORMIN (GLUCOPHAGE) 1000 MG tablet Take 1,000 mg by mouth 2 (two) times daily with a meal.    . omeprazole (PRILOSEC) 40 MG capsule Take 1 capsule (40 mg total) by mouth daily. 30 capsule 3  . oxyCODONE-acetaminophen (PERCOCET/ROXICET) 5-325 MG tablet Take 1 tablet by mouth 3 (three) times daily as needed for moderate pain.   0  . quinapril-hydrochlorothiazide (ACCURETIC) 20-12.5 MG tablet Take 1 tablet by mouth daily.  0  . simvastatin (ZOCOR) 20 MG tablet Take 20 mg by mouth daily.     No current facility-administered medications for this visit.     Functional Status:  In your present state of health, do you have any  difficulty performing the following activities: 12/11/2017 08/03/2017  Hearing? N N  Vision? N N  Difficulty concentrating or making decisions? N N  Walking or climbing stairs? N N  Dressing or bathing? N N  Doing errands, shopping? N N  Preparing Food and eating ? N N  Using the Toilet? N N  In the past six months, have you accidently leaked urine? N N  Do you have problems with loss of bowel control? N N  Managing your Medications? N N  Managing your Finances? N N  Housekeeping or managing your Housekeeping? N N  Some recent data might be hidden    Fall/Depression Screening: Fall Risk  12/11/2017 08/03/2017 05/28/2017  Falls in the past year? No No No   PHQ 2/9 Scores 12/11/2017 08/03/2017 05/28/2017 05/01/2017 02/14/2017  PHQ - 2 Score 0 0 0 0 0   THN CM Care Plan Problem One     Most Recent Value  Care Plan Problem One  Knowledge Deficit in self management of diabetes  Role Documenting the Problem One  [place] for Problem One  Active  THN Long Term Goal   Patient will see a decrease in A1C from 10.4 with the next blood draw  THN Long Term Goal Start Date  12/11/17  Interventions for Problem One Long Term Goal  RN discussed what the A1C is. RN discussed how the fasting blood sugars affect the A1C.  RN gave the patient a range of 80-130 for fasting blood sugars to be <7. RN will follow up with each out reach  [facility] CM Short Term Goal #1   Patient will take medications as prescribed within the next 90 days  Interventions for Short Term Goal #1  RN discussed with patient about medication adherence. RN discussed with patient about taking oral medication in pudding versus applesause for smoother less texture. RN will follow up with next outreach for compliancer   [facility] CM Short Term Goal #2   Patient will report checking blood sugars daily and documenting within the next 30 days  THN CM Short Term Goal #2 Start Date  12/11/17  Interventions for Short Term Goal #2  RN discussed the  importance of checking blood sugars. RN discussed with patint about documenting them to see if the fasting blood sugars are 130 or less. RN will followw up with further outreach and discussion       Assessment:  A1C 10.4 Patient has difficulty swallowing andchokes easily Patient is not taking his oral diabetic agents regularly Patient is now also on insulin Patient will benefit from [place] telephonic outreach for education and support for diabetes self management.   Plan:  RN discussed what the A1C means RN discussed importance of checking blood sugars RN sent EMMI on A1C RN sent EMMI on Why check your blood sugars RN discussed soft foods to eat RN sent clinical key and EMMI on soft foods RN referred to pharmacy RN will follow up within the month of July  Dylan Shea [place] Management [phone]

## 2017-12-11 NOTE — Addendum Note (Signed)
Addended by: Verlin Grills on: 12/11/2017 11:43 AM   Modules accepted: Orders

## 2017-12-12 ENCOUNTER — Other Ambulatory Visit: Payer: Self-pay

## 2017-12-12 ENCOUNTER — Ambulatory Visit: Payer: Self-pay

## 2017-12-12 NOTE — Patient Outreach (Signed)
[place] [facility]) Care Management  12/12/2017  Dylan Shea 1951-05-27 [id number]  67 year old male referred to [place] Management.  [place] services requested for assistance with converting to liquid medications for dysphagia. Marland Kitchen  PMHx includes, but not limited to, dysphagia, GERD, hypertension, hyperlipidemia and diabetes.  Unsuccessful outreach attempt #1 to Dylan Shea.  Voice mailbox would not accept new messages.  Plan: Outreach attempt #2 in 3-4 business days.  Will send unsuccessful outreach letter to Dylan Shea.   Joetta Manners, PharmD Clinical Pharmacist [place] [phone]

## 2017-12-17 ENCOUNTER — Other Ambulatory Visit: Payer: Self-pay

## 2017-12-17 ENCOUNTER — Ambulatory Visit: Payer: Self-pay

## 2017-12-17 NOTE — Patient Outreach (Signed)
    [facility]) Care Management  12/17/2017  Dylan Shea August 03, 1950 [id number]  67 year old male referred to [place] Management.  [place] services requested for assistance with converting to liquid medications for dysphagia. Marland Kitchen  PMHx includes, but not limited to, dysphagia, GERD, hypertension, hyperlipidemia and diabetes.  Unsuccessful outreach attempt #2 to Dylan Shea.  Voice mailbox is full and will not accept new messages.  Plan: Outreach # 3 in 3 business days.  Joetta Manners, PharmD Clinical Pharmacist [place] [phone]

## 2017-12-20 ENCOUNTER — Other Ambulatory Visit: Payer: Self-pay

## 2017-12-20 NOTE — Patient Outreach (Signed)
[facility] [facility]) Care Management  12/20/2017  Perri Lamagna [birth year] [id number]    67year old malereferred to [place] Management.[place] services requested for assistance with converting to liquid medications for dysphagia.Marland Kitchen PMHx includes, but not limited to, dysphagia, GERD, hypertension, hyperlipidemia and diabetes.  Success outreach attempt to Mr. Shinall.  HIPAA identifiers verified.   Subjective: Mr. Nudd states that his wife handles all of his medications.  He states that she is at work, but gets off at 2 pm.  He request that I call back and speak with her.  He reports that he has starting crushing his pills and putting them in apple sauce or pudding and that this is working well.  Plan: Outreach to Mrs. Fiore after 2 pm on Monday.  Joetta Manners, PharmD Clinical Pharmacist [place] [phone]

## 2017-12-24 ENCOUNTER — Other Ambulatory Visit: Payer: Self-pay

## 2017-12-24 ENCOUNTER — Ambulatory Visit: Payer: Self-pay

## 2017-12-24 NOTE — Patient Outreach (Signed)
[place] [facility]) Care Management  12/24/2017  Infant Zink 06-10-1951 [id number]  33year old malereferred to [place] Management.[place] services requested for assistance with converting to liquid medications for dysphagia.Marland Kitchen PMHx includes, but not limited to, dysphagia, GERD, hypertension, hyperlipidemia and diabetes.  Unsuccessful outreach attempt to Mr. Blatz.  He had requested that I call after 2pm today, so that his wife would be home to discuss his medications.  Answering machine cannot accept voice mail.   Plan: Outreach attempt tomorrow after 2pm.  Joetta Manners, [place] [phone]

## 2017-12-25 ENCOUNTER — Ambulatory Visit: Payer: Self-pay

## 2017-12-25 ENCOUNTER — Other Ambulatory Visit: Payer: Self-pay

## 2017-12-25 NOTE — Patient Outreach (Signed)
[place] [facility]) Care Management  12/25/2017  Terris Germano Jun 25, 1951 [id number]   67year old malereferred to [place] Management.[place] services requested for assistance with converting to liquid medications for dysphagia.Marland Kitchen PMHx includes, but not limited to, dysphagia, GERD, hypertension, hyperlipidemia and diabetes.  Successful outreach attempt to Mr. Berwick.  HIPAA identifiers verified.  Unfortunately, his wife is at a doctor's appointment today.  Requests that I call her back Thursday after 2 pm.  Plan: Outreach attempt Thursday after 2 pm.  Joetta Manners, [place] [phone]

## 2017-12-27 ENCOUNTER — Ambulatory Visit: Payer: Self-pay

## 2017-12-27 ENCOUNTER — Other Ambulatory Visit: Payer: Self-pay

## 2017-12-27 NOTE — Patient Outreach (Signed)
[place] [facility]) Care Management  12/27/2017  Dylan Shea [birth year] [id number]  67year old malereferred to [place] Management.[place] services requested for assistance with converting to liquid medications for dysphagia.Marland Kitchen PMHx includes, but not limited to, dysphagia, GERD, hypertension, hyperlipidemia and diabetes.  Successful outreach attempt to Dylan Shea.  HIPAA identifiers verified.  DylanShea states that he is in the shop and his wife is at home.  He had previously requested that I speak with his wife because she manages all of his medications.  He states that there is not a direct number for me to call and speak with her.  He requests that I call back at 4 pm and that in the interim he will go to the house and be ready for my call.   Returned call at 4 pm.  The phone was picked up and disconnected.  I then called the number back and it went straight to voice mail that was full.  I waited five minutes and called back.  Dylan Shea answered and said that his wife did not want to speak to me.  Informed him that I will allow his wife to call me is she desires.  He states that he has my number on his phone if she wants to call.    Informed Dylan Shea that I will close his [place] case at this time.  He is aware that he or his wife can call me back in the future should medication issues or questions arise.   Plan: Send discipline closure letter to PCP, Dr. Sherrie Sport.  Inform THN RN, Johny Shock of Pharmacy case closure.   Joetta Manners, PharmD Clinical Pharmacist [place] [phone]

## 2018-01-10 ENCOUNTER — Other Ambulatory Visit: Payer: Self-pay

## 2018-01-10 ENCOUNTER — Other Ambulatory Visit: Payer: Self-pay | Admitting: *Deleted

## 2018-01-10 NOTE — Patient Outreach (Signed)
[place] [facility]) Care Management  01/10/2018  Dylan Shea March 30, 1951 [id number]   RN Health Coach  Attempted #1  outreach call to patient.  Patient was unavailable. No voice mail pickup. Plan: RN will call patient again within 10 business  days.   [place] Care Management [phone]

## 2018-01-15 DIAGNOSIS — M545 Low back pain: Secondary | ICD-10-CM | POA: Diagnosis not present

## 2018-01-15 DIAGNOSIS — E7849 Other hyperlipidemia: Secondary | ICD-10-CM | POA: Diagnosis not present

## 2018-01-15 DIAGNOSIS — I10 Essential (primary) hypertension: Secondary | ICD-10-CM | POA: Diagnosis not present

## 2018-01-15 DIAGNOSIS — E1165 Type 2 diabetes mellitus with hyperglycemia: Secondary | ICD-10-CM | POA: Diagnosis not present

## 2018-01-15 DIAGNOSIS — Z0000 Encounter for general adult medical examination without abnormal findings: Secondary | ICD-10-CM | POA: Diagnosis not present

## 2018-01-23 ENCOUNTER — Other Ambulatory Visit: Payer: Self-pay | Admitting: *Deleted

## 2018-01-23 DIAGNOSIS — E1165 Type 2 diabetes mellitus with hyperglycemia: Secondary | ICD-10-CM | POA: Diagnosis not present

## 2018-01-23 DIAGNOSIS — M545 Low back pain: Secondary | ICD-10-CM | POA: Diagnosis not present

## 2018-01-23 DIAGNOSIS — E7849 Other hyperlipidemia: Secondary | ICD-10-CM | POA: Diagnosis not present

## 2018-01-23 DIAGNOSIS — I10 Essential (primary) hypertension: Secondary | ICD-10-CM | POA: Diagnosis not present

## 2018-01-23 NOTE — Patient Outreach (Signed)
[facility]) Care Management  01/23/2018  Raihan Kimmel [birth year] [id number]   [place] attempted #2 follow up outreach call to patient.  Patient  Stated that he was working and requested that Health coach call back at another time.  Plan: RN will call patient again within 10 business days.  [place] Care Management [phone]

## 2018-02-05 ENCOUNTER — Other Ambulatory Visit: Payer: Self-pay | Admitting: *Deleted

## 2018-02-05 NOTE — Patient Outreach (Signed)
[place] [facility]) Care Management  02/05/2018  Dylan Shea [birth year] [id number]   [place] attempted#3  follow up outreach call to patient.  Patient was unavailable .Per voic mailbox it is full  Plan: Unsuccessful out reach letter sent Will follow up with case closure within 10 business days if no response from outreach letter.   [place] Care Management [phone]

## 2018-02-19 DIAGNOSIS — I10 Essential (primary) hypertension: Secondary | ICD-10-CM | POA: Diagnosis not present

## 2018-02-19 DIAGNOSIS — M545 Low back pain: Secondary | ICD-10-CM | POA: Diagnosis not present

## 2018-02-19 DIAGNOSIS — E7849 Other hyperlipidemia: Secondary | ICD-10-CM | POA: Diagnosis not present

## 2018-02-19 DIAGNOSIS — E1165 Type 2 diabetes mellitus with hyperglycemia: Secondary | ICD-10-CM | POA: Diagnosis not present

## 2018-03-18 DIAGNOSIS — E1165 Type 2 diabetes mellitus with hyperglycemia: Secondary | ICD-10-CM | POA: Diagnosis not present

## 2018-03-18 DIAGNOSIS — E7849 Other hyperlipidemia: Secondary | ICD-10-CM | POA: Diagnosis not present

## 2018-03-18 DIAGNOSIS — I10 Essential (primary) hypertension: Secondary | ICD-10-CM | POA: Diagnosis not present

## 2018-03-18 DIAGNOSIS — M545 Low back pain: Secondary | ICD-10-CM | POA: Diagnosis not present

## 2018-03-21 DIAGNOSIS — E1165 Type 2 diabetes mellitus with hyperglycemia: Secondary | ICD-10-CM | POA: Diagnosis not present

## 2018-03-21 DIAGNOSIS — I10 Essential (primary) hypertension: Secondary | ICD-10-CM | POA: Diagnosis not present

## 2018-03-21 DIAGNOSIS — M545 Low back pain: Secondary | ICD-10-CM | POA: Diagnosis not present

## 2018-03-21 DIAGNOSIS — E7849 Other hyperlipidemia: Secondary | ICD-10-CM | POA: Diagnosis not present

## 2018-04-10 ENCOUNTER — Encounter (INDEPENDENT_AMBULATORY_CARE_PROVIDER_SITE_OTHER): Payer: Self-pay | Admitting: *Deleted

## 2018-04-10 ENCOUNTER — Encounter (INDEPENDENT_AMBULATORY_CARE_PROVIDER_SITE_OTHER): Payer: Self-pay | Admitting: Internal Medicine

## 2018-04-10 ENCOUNTER — Ambulatory Visit (INDEPENDENT_AMBULATORY_CARE_PROVIDER_SITE_OTHER): Payer: Medicare Other | Admitting: Internal Medicine

## 2018-04-10 VITALS — BP 152/80 | HR 56 | Temp 97.6°F | Ht 73.0 in | Wt 195.2 lb

## 2018-04-10 DIAGNOSIS — R1319 Other dysphagia: Secondary | ICD-10-CM

## 2018-04-10 DIAGNOSIS — R1310 Dysphagia, unspecified: Secondary | ICD-10-CM

## 2018-04-10 NOTE — Patient Instructions (Signed)
DG Esophagram.  

## 2018-04-10 NOTE — Progress Notes (Signed)
   Subjective:    Patient ID: Dylan Shea, male    DOB: Sep 12, 1950, 67 y.o.   MRN: [id number]  HPI Presents today with c/o dysphagia. Hx of same. Last EGD was in October of 2018. Normal esophagus. No endoscopic esophageal abnormality to explain patient's dysphagia. Esophagus dilated. Normal stomach, normal duodenal bulb and second portion of the duodenum. He tells me he is having some dysphagia. He says the EGD/ED in 2018 did not last but for about a weeks.  Wife states everything is choking him. Liquids are bothering him. BMs are moving okay. No melena or BRRB.     Review of Systems Past Medical History:  Diagnosis Date  . Diabetes ([place]) 01/10/2017  . GERD (gastroesophageal reflux disease)   . Hypercholesteremia   . Hypertension     Past Surgical History:  Procedure Laterality Date  . COLONOSCOPY    . ESOPHAGEAL DILATION N/A 04/26/2017   Procedure: ESOPHAGEAL DILATION;  Surgeon: Rogene Houston, MD;  [facility];  Service: Endoscopy;  Laterality: N/A;  . ESOPHAGOGASTRODUODENOSCOPY N/A 04/26/2017   Procedure: ESOPHAGOGASTRODUODENOSCOPY (EGD);  Surgeon: Rogene Houston, MD;  [facility];  Service: Endoscopy;  Laterality: N/A;  12:45    No Known Allergies  Current Outpatient Medications on File Prior to Visit  Medication Sig Dispense Refill  . empagliflozin (JARDIANCE) 25 MG TABS tablet Take 25 mg by mouth daily.    Marland Kitchen glimepiride (AMARYL) 4 MG tablet Take 4 mg by mouth daily with breakfast.    . insulin glargine (LANTUS) 100 UNIT/ML injection Inject into the skin daily.    . insulin glargine (LANTUS) 100 UNIT/ML injection Inject 20 Units into the skin at bedtime.    . metFORMIN (GLUCOPHAGE) 1000 MG tablet Take 1,000 mg by mouth 2 (two) times daily with a meal.    . omeprazole (PRILOSEC) 40 MG capsule Take 1 capsule (40 mg total) by mouth daily. 30 capsule 3  . oxyCODONE-acetaminophen (PERCOCET/ROXICET) 5-325 MG tablet Take 1 tablet by mouth 3 (three)  times daily as needed for moderate pain.   0  . quinapril-hydrochlorothiazide (ACCURETIC) 20-12.5 MG tablet Take 1 tablet by mouth daily.  0  . simvastatin (ZOCOR) 20 MG tablet Take 20 mg by mouth daily.     No current facility-administered medications on file prior to visit.         Objective:   Physical Exam. Blood pressure (!) 152/80, pulse (!) 56, temperature 97.6 F (36.4 C), height 6\' 1"  (1.854 m), weight 195 lb 3.2 oz (88.5 kg). Alert and oriented. Skin warm and dry. Oral mucosa is moist.   . Sclera anicteric, conjunctivae is pink. Thyroid not enlarged. No cervical lymphadenopathy. Lungs clear. Heart regular rate and rhythm.  Abdomen is soft. Bowel sounds are positive. No hepatomegaly. No abdominal masses felt. No tenderness.  No edema to lower extremities.           Assessment & Plan:  Dysphagia. Am going to get a DG Esophagram. Further recommendation to follow.

## 2018-04-10 NOTE — Progress Notes (Signed)
Patient ID: Dylan Shea, male   DOB: [birth year], 67 y.o.   MRN: [id number]

## 2018-04-15 ENCOUNTER — Ambulatory Visit (HOSPITAL_COMMUNITY)
Admission: RE | Admit: 2018-04-15 | Discharge: 2018-04-15 | Disposition: A | Discharge status: Home / Self Care | Payer: Medicare Other | Source: Ambulatory Visit | Attending: Internal Medicine | Admitting: Internal Medicine

## 2018-04-15 DIAGNOSIS — T17320A Food in larynx causing asphyxiation, initial encounter: Secondary | ICD-10-CM | POA: Diagnosis not present

## 2018-04-15 DIAGNOSIS — R05 Cough: Secondary | ICD-10-CM | POA: Diagnosis not present

## 2018-04-15 DIAGNOSIS — R918 Other nonspecific abnormal finding of lung field: Secondary | ICD-10-CM | POA: Diagnosis not present

## 2018-04-15 DIAGNOSIS — R1310 Dysphagia, unspecified: Secondary | ICD-10-CM | POA: Diagnosis not present

## 2018-04-15 DIAGNOSIS — R1319 Other dysphagia: Secondary | ICD-10-CM

## 2018-04-15 DIAGNOSIS — T17308A Unspecified foreign body in larynx causing other injury, initial encounter: Secondary | ICD-10-CM | POA: Diagnosis not present

## 2018-04-16 ENCOUNTER — Telehealth (INDEPENDENT_AMBULATORY_CARE_PROVIDER_SITE_OTHER): Payer: Self-pay | Admitting: Internal Medicine

## 2018-04-16 ENCOUNTER — Other Ambulatory Visit (INDEPENDENT_AMBULATORY_CARE_PROVIDER_SITE_OTHER): Payer: Self-pay | Admitting: Internal Medicine

## 2018-04-16 DIAGNOSIS — R1310 Dysphagia, unspecified: Secondary | ICD-10-CM

## 2018-04-16 NOTE — Telephone Encounter (Signed)
Order placed, they will contact patient

## 2018-04-16 NOTE — Telephone Encounter (Signed)
Needs Modified Barium swallow with speech pathology

## 2018-04-17 DIAGNOSIS — M545 Low back pain: Secondary | ICD-10-CM | POA: Diagnosis not present

## 2018-04-17 DIAGNOSIS — E7849 Other hyperlipidemia: Secondary | ICD-10-CM | POA: Diagnosis not present

## 2018-04-17 DIAGNOSIS — E1165 Type 2 diabetes mellitus with hyperglycemia: Secondary | ICD-10-CM | POA: Diagnosis not present

## 2018-04-17 DIAGNOSIS — I10 Essential (primary) hypertension: Secondary | ICD-10-CM | POA: Diagnosis not present

## 2018-04-18 ENCOUNTER — Other Ambulatory Visit (HOSPITAL_COMMUNITY): Payer: Self-pay | Admitting: Specialist

## 2018-04-18 DIAGNOSIS — R1319 Other dysphagia: Secondary | ICD-10-CM

## 2018-04-25 ENCOUNTER — Ambulatory Visit (HOSPITAL_COMMUNITY): Payer: Medicare Other | Attending: Internal Medicine | Admitting: Speech Pathology

## 2018-04-25 ENCOUNTER — Ambulatory Visit (HOSPITAL_COMMUNITY)
Admission: RE | Admit: 2018-04-25 | Discharge: 2018-04-25 | Disposition: A | Discharge status: Home / Self Care | Payer: Medicare Other | Source: Ambulatory Visit | Attending: Internal Medicine | Admitting: Internal Medicine

## 2018-04-25 DIAGNOSIS — T17320A Food in larynx causing asphyxiation, initial encounter: Secondary | ICD-10-CM | POA: Diagnosis not present

## 2018-04-25 DIAGNOSIS — R1312 Dysphagia, oropharyngeal phase: Secondary | ICD-10-CM | POA: Diagnosis not present

## 2018-04-25 DIAGNOSIS — R1310 Dysphagia, unspecified: Secondary | ICD-10-CM | POA: Diagnosis not present

## 2018-04-25 DIAGNOSIS — R1319 Other dysphagia: Secondary | ICD-10-CM | POA: Insufficient documentation

## 2018-04-29 ENCOUNTER — Other Ambulatory Visit: Payer: Self-pay

## 2018-04-29 ENCOUNTER — Encounter (HOSPITAL_COMMUNITY): Payer: Self-pay | Admitting: Speech Pathology

## 2018-04-30 NOTE — Therapy (Signed)
[place], [place], [address] Phone: [phone]   Fax:  [phone]  Modified Barium Swallow  Patient Details  Name: Dylan Shea MRN: [id number] Date of Birth: [birth year] No data recorded  Encounter Date: 04/25/2018    Past Medical History:  Diagnosis Date  . Diabetes ([place]) 01/10/2017  . GERD (gastroesophageal reflux disease)   . Hypercholesteremia   . Hypertension     Past Surgical History:  Procedure Laterality Date  . COLONOSCOPY    . ESOPHAGEAL DILATION N/A 04/26/2017   Procedure: ESOPHAGEAL DILATION;  Surgeon: Rogene Houston, MD;  [facility];  Service: Endoscopy;  Laterality: N/A;  . ESOPHAGOGASTRODUODENOSCOPY N/A 04/26/2017   Procedure: ESOPHAGOGASTRODUODENOSCOPY (EGD);  Surgeon: Rogene Houston, MD;  [facility];  Service: Endoscopy;  Laterality: N/A;  12:45    There were no vitals filed for this visit.  Subjective Assessment - 04/25/18 1222    Subjective  "My voice has always been hoarse."    Patient is accompained by:  Family member    Special Tests  MBSS    Currently in Pain?  No/denies        General - 04/25/18 1223      General Information   Date of Onset  04/15/18    HPI  Dylan Shea is a 67 yo male who was referred by Deberah Castle and Dr. Laural Golden for MBSS due to suspected oropharyngeal dysphagia. Pt had EGD in Oct 2018 with empirical dilation, which relieved Pt's symptoms short term. His wife states that Pt is choking on everything. He had a Ba Swallow on 04/15/2018 which showed: Laryngeal penetration and significant aspiration of contrast into the trachea, RIGHT mainstem bronchus and into RIGHT lower lobe. Markedly delayed and diminished spontaneous cough reflex with poor clearance of aspirated barium. No esophageal stricture identified, with 12.5 mm diameter barium tablet able to passed oral cavity without obstruction. He was accompanied to today's MBSS  by his wife.    Type of Study  MBS-Modified Barium Swallow Study    Previous Swallow Assessment  BaSw 04/2018    Diet Prior to this Study  Regular;Thin liquids    Temperature Spikes Noted  No    Respiratory Status  Room air    History of Recent Intubation  No    Behavior/Cognition  Alert;Cooperative;Pleasant mood    Oral Cavity Assessment  Within Functional Limits    Oral Care Completed by SLP  No    Oral Cavity - Dentition  Adequate natural dentition    Vision  Functional for self feeding    Self-Feeding Abilities  Able to feed self    Patient Positioning  Upright in chair    Baseline Vocal Quality  Hoarse   extremely hoarse and elevated pitch   Volitional Cough  Strong    Volitional Swallow  Able to elicit    Anatomy  Other (Comment)   prominent spurs and dense calcification C3-5   Pharyngeal Secretions  Not observed secondary MBS         Oral Preparation/Oral Phase - 04/25/18 1229      Oral Preparation/Oral Phase   Oral Phase  Within functional limits      Electrical stimulation - Oral Phase   Was Electrical Stimulation Used  No       Pharyngeal Phase - 04/25/18 1229      Pharyngeal Phase   Pharyngeal Phase  Impaired      Pharyngeal - Honey  Pharyngeal- Honey Cup  Delayed swallow initiation;Swallow initiation at vallecula;Reduced pharyngeal peristalsis;Reduced epiglottic inversion;Reduced airway/laryngeal closure;Reduced tongue base retraction;Trace aspiration;Penetration/Apiration after swallow;Pharyngeal residue - valleculae;Pharyngeal residue - pyriform    Pharyngeal  Material enters airway, passes BELOW cords and not ejected out despite cough attempt by patient;Material enters airway, remains ABOVE vocal cords then ejected out      Pharyngeal - Nectar   Pharyngeal- Nectar Cup  Swallow initiation at vallecula;Swallow initiation at pyriform sinus;Reduced pharyngeal peristalsis;Reduced epiglottic inversion;Reduced airway/laryngeal closure;Reduced tongue base  retraction;Penetration/Aspiration during swallow;Penetration/Apiration after swallow;Moderate aspiration;Pharyngeal residue - valleculae;Pharyngeal residue - pyriform;Inter-arytenoid space residue    Pharyngeal  Material enters airway, passes BELOW cords and not ejected out despite cough attempt by patient;Material enters airway, remains ABOVE vocal cords then ejected out      Pharyngeal - Thin   Pharyngeal- Thin Teaspoon  Delayed swallow initiation;Swallow initiation at pyriform sinus;Reduced pharyngeal peristalsis;Reduced epiglottic inversion;Reduced airway/laryngeal closure;Reduced tongue base retraction;Penetration/Aspiration before swallow;Trace aspiration;Pharyngeal residue - valleculae;Pharyngeal residue - pyriform;Inter-arytenoid space residue    Pharyngeal  Material enters airway, passes BELOW cords and not ejected out despite cough attempt by patient    Pharyngeal- Thin Cup  Delayed swallow initiation;Swallow initiation at vallecula;Swallow initiation at pyriform sinus;Reduced pharyngeal peristalsis;Reduced epiglottic inversion;Reduced airway/laryngeal closure;Reduced tongue base retraction;Penetration/Aspiration before swallow;Penetration/Apiration after swallow;Trace aspiration;Pharyngeal residue - valleculae;Pharyngeal residue - pyriform    Pharyngeal  Material does not enter airway;Material enters airway, remains ABOVE vocal cords then ejected out;Material enters airway, passes BELOW cords and not ejected out despite cough attempt by patient      Pharyngeal - Solids   Pharyngeal- Puree  Swallow initiation at vallecula;Reduced pharyngeal peristalsis;Reduced epiglottic inversion;Reduced tongue base retraction;Pharyngeal residue - valleculae;Pharyngeal residue - pyriform    Pharyngeal- Regular  Delayed swallow initiation-vallecula;Reduced tongue base retraction;Reduced pharyngeal peristalsis;Pharyngeal residue - valleculae;Pharyngeal residue - pyriform    Pharyngeal- Pill  Not tested       Electrical Stimulation - Pharyngeal Phase   Was Electrical Stimulation Used  No       Cricopharyngeal Phase - 04/25/18 1237      Cervical Esophageal Phase   Cervical Esophageal Phase  Impaired      Cervical Esophageal Phase - Solids   Puree  Reduced cricopharyngeal relaxation        Plan - 04/25/18 1240    Clinical Impression Statement Pt presents with moderate pharyngeal phase dysphagia characterized by swallow trigger at the level of the valleculae, reduced tongue base retraction and epiglottic deflection, reduced pharyngeal wall stripping, and reduced vocal fold closure resulting in aspiration before the swallow with initial tsp presentation (positive for cough, but not removed) and penetration during and aspiration after the swallow with thins, nectars, and honey thick liquids due to residuals. Pt's primary reasons for aspiration appear to be due to structural changes along C3-5 impeding in pharyngeal space resulting in incomplete epiglottic deflection (epiglottis observed to deflect only one time with regular textures/heavier bolus) and suspect reduced vocal fold integrity as Pt with extremely hoarse voice with elevated pitch. Pt is at significant risk for aspiration with all liquids and significant risk for continued weight loss. Suspect that Pt has endured aspiration of liquids for quite some time and has managed to avoid illness due to strong cough, good oral care, and good mobility. Given risks for aspiration are present for all liquids and increased residuals noted with thicker bolus (and likely more difficult to clear from lungs), recommend continuation of thin liquids via small sips with implementation of modified supraglottic swallow (Pt to  take sip, hold breath, swallow, cough, and swallow again). The chin tuck should not be utilized as this made Pt aspirate in greater amounts. Pt is generally able to sense aspiration, but unable to fully clear. Strongly recommend ENT consult given  hoarse vocal quality (Consider ENT at [facility] in the Voice and Swallowing clinic). Pt could also be seen with speech pathologist at that clinic for further recommendations. He can be seen at our outpatient clinic following ENT recommendations. This information was written down for Pt and his wife.    Speech Therapy Frequency  --   will follow after ENT consult   Treatment/Interventions  Aspiration precaution training;SLP instruction and feedback;Compensatory strategies;Patient/family education    Consulted and Agree with Plan of Care  Patient       Patient will benefit from skilled therapeutic intervention in order to improve the following deficits and impairments:   Dysphagia, oropharyngeal phase    Recommendations/Treatment - 04/25/18 1237      Swallow Evaluation Recommendations   Recommended Consults  Consider ENT evaluation    SLP Diet Recommendations  Thin;Dysphagia 3 (mechanical soft)    Liquid Administration via  Cup;No straw    Medication Administration  Crushed with puree    Supervision  Patient able to self feed    Compensations  Slow rate;Small sips/bites;Multiple dry swallows after each bite/sip;Clear throat after each swallow   chin tuck ineffective and worse   Postural Changes  Seated upright at 90 degrees;Remain upright for at least 30 minutes after feeds/meals       Prognosis - 04/25/18 1239      Prognosis   Prognosis for Safe Diet Advancement  Guarded    Barriers to Reach Goals  Severity of deficits      Individuals Consulted   Consulted and Agree with Results and Recommendations  Patient;Family member/caregiver    Report Sent to   Referring physician       Problem List Patient Active Problem List   Diagnosis Date Noted  . Diabetes ([place]) 01/10/2017  . Esophageal dysphagia 01/10/2017   Thank you,  Genene Churn, [place]  [facility] 04/25/2018, 12:41 PM  [place], [place], [address] Phone: [phone]   Fax:  [phone]  Name: Dylan Shea MRN: [id number] Date of Birth: [birth year]

## 2018-05-03 ENCOUNTER — Telehealth (INDEPENDENT_AMBULATORY_CARE_PROVIDER_SITE_OTHER): Payer: Self-pay | Admitting: Internal Medicine

## 2018-05-03 NOTE — Telephone Encounter (Signed)
appt with Dr Joya Gaskins 05/07/18, patient aware

## 2018-05-03 NOTE — Telephone Encounter (Signed)
I have spoken with patient.   Dylan Shea, needs referral to [facility] ENT, Voice and Swallowing Clinc.  Thanks.

## 2018-05-07 DIAGNOSIS — J383 Other diseases of vocal cords: Secondary | ICD-10-CM | POA: Diagnosis not present

## 2018-05-07 DIAGNOSIS — J384 Edema of larynx: Secondary | ICD-10-CM | POA: Diagnosis not present

## 2018-05-07 DIAGNOSIS — M2578 Osteophyte, vertebrae: Secondary | ICD-10-CM | POA: Diagnosis not present

## 2018-05-07 DIAGNOSIS — R1314 Dysphagia, pharyngoesophageal phase: Secondary | ICD-10-CM | POA: Diagnosis not present

## 2018-05-15 DIAGNOSIS — E1165 Type 2 diabetes mellitus with hyperglycemia: Secondary | ICD-10-CM | POA: Diagnosis not present

## 2018-05-15 DIAGNOSIS — I10 Essential (primary) hypertension: Secondary | ICD-10-CM | POA: Diagnosis not present

## 2018-05-15 DIAGNOSIS — M545 Low back pain: Secondary | ICD-10-CM | POA: Diagnosis not present

## 2018-05-15 DIAGNOSIS — E7849 Other hyperlipidemia: Secondary | ICD-10-CM | POA: Diagnosis not present

## 2018-05-20 DIAGNOSIS — M545 Low back pain: Secondary | ICD-10-CM | POA: Diagnosis not present

## 2018-05-20 DIAGNOSIS — E1165 Type 2 diabetes mellitus with hyperglycemia: Secondary | ICD-10-CM | POA: Diagnosis not present

## 2018-05-20 DIAGNOSIS — R1313 Dysphagia, pharyngeal phase: Secondary | ICD-10-CM | POA: Diagnosis not present

## 2018-05-20 DIAGNOSIS — E7849 Other hyperlipidemia: Secondary | ICD-10-CM | POA: Diagnosis not present

## 2018-05-20 DIAGNOSIS — I10 Essential (primary) hypertension: Secondary | ICD-10-CM | POA: Diagnosis not present

## 2018-05-28 DIAGNOSIS — R1314 Dysphagia, pharyngoesophageal phase: Secondary | ICD-10-CM | POA: Diagnosis not present

## 2018-05-28 DIAGNOSIS — J386 Stenosis of larynx: Secondary | ICD-10-CM | POA: Diagnosis not present

## 2018-05-28 DIAGNOSIS — R1313 Dysphagia, pharyngeal phase: Secondary | ICD-10-CM | POA: Diagnosis not present

## 2018-05-28 DIAGNOSIS — J392 Other diseases of pharynx: Secondary | ICD-10-CM | POA: Diagnosis not present

## 2018-05-28 DIAGNOSIS — R1310 Dysphagia, unspecified: Secondary | ICD-10-CM | POA: Diagnosis not present

## 2018-06-20 DIAGNOSIS — E7849 Other hyperlipidemia: Secondary | ICD-10-CM | POA: Diagnosis not present

## 2018-06-20 DIAGNOSIS — M545 Low back pain: Secondary | ICD-10-CM | POA: Diagnosis not present

## 2018-06-20 DIAGNOSIS — I10 Essential (primary) hypertension: Secondary | ICD-10-CM | POA: Diagnosis not present

## 2018-06-20 DIAGNOSIS — E1165 Type 2 diabetes mellitus with hyperglycemia: Secondary | ICD-10-CM | POA: Diagnosis not present

## 2018-07-18 DIAGNOSIS — Z1389 Encounter for screening for other disorder: Secondary | ICD-10-CM | POA: Diagnosis not present

## 2018-07-18 DIAGNOSIS — E7849 Other hyperlipidemia: Secondary | ICD-10-CM | POA: Diagnosis not present

## 2018-07-18 DIAGNOSIS — I10 Essential (primary) hypertension: Secondary | ICD-10-CM | POA: Diagnosis not present

## 2018-07-18 DIAGNOSIS — M545 Low back pain: Secondary | ICD-10-CM | POA: Diagnosis not present

## 2018-07-18 DIAGNOSIS — Z0000 Encounter for general adult medical examination without abnormal findings: Secondary | ICD-10-CM | POA: Diagnosis not present

## 2018-07-18 DIAGNOSIS — E1165 Type 2 diabetes mellitus with hyperglycemia: Secondary | ICD-10-CM | POA: Diagnosis not present

## 2018-07-19 DIAGNOSIS — I10 Essential (primary) hypertension: Secondary | ICD-10-CM | POA: Diagnosis not present

## 2018-07-19 DIAGNOSIS — Z0000 Encounter for general adult medical examination without abnormal findings: Secondary | ICD-10-CM | POA: Diagnosis not present

## 2018-07-19 DIAGNOSIS — E7849 Other hyperlipidemia: Secondary | ICD-10-CM | POA: Diagnosis not present

## 2018-07-19 DIAGNOSIS — E1165 Type 2 diabetes mellitus with hyperglycemia: Secondary | ICD-10-CM | POA: Diagnosis not present

## 2018-07-19 DIAGNOSIS — Z1389 Encounter for screening for other disorder: Secondary | ICD-10-CM | POA: Diagnosis not present

## 2018-07-19 DIAGNOSIS — M545 Low back pain: Secondary | ICD-10-CM | POA: Diagnosis not present

## 2018-08-13 DIAGNOSIS — M47812 Spondylosis without myelopathy or radiculopathy, cervical region: Secondary | ICD-10-CM | POA: Diagnosis not present

## 2018-08-13 DIAGNOSIS — R1319 Other dysphagia: Secondary | ICD-10-CM | POA: Diagnosis not present

## 2018-08-13 DIAGNOSIS — M542 Cervicalgia: Secondary | ICD-10-CM | POA: Diagnosis not present

## 2018-08-30 DIAGNOSIS — E1165 Type 2 diabetes mellitus with hyperglycemia: Secondary | ICD-10-CM | POA: Diagnosis not present

## 2018-08-30 DIAGNOSIS — I10 Essential (primary) hypertension: Secondary | ICD-10-CM | POA: Diagnosis not present

## 2018-08-30 DIAGNOSIS — E7849 Other hyperlipidemia: Secondary | ICD-10-CM | POA: Diagnosis not present

## 2018-08-30 DIAGNOSIS — M545 Low back pain: Secondary | ICD-10-CM | POA: Diagnosis not present

## 2018-09-18 DIAGNOSIS — E7849 Other hyperlipidemia: Secondary | ICD-10-CM | POA: Diagnosis not present

## 2018-09-18 DIAGNOSIS — M545 Low back pain: Secondary | ICD-10-CM | POA: Diagnosis not present

## 2018-09-18 DIAGNOSIS — E1165 Type 2 diabetes mellitus with hyperglycemia: Secondary | ICD-10-CM | POA: Diagnosis not present

## 2018-09-18 DIAGNOSIS — Z0000 Encounter for general adult medical examination without abnormal findings: Secondary | ICD-10-CM | POA: Diagnosis not present

## 2018-09-18 DIAGNOSIS — I10 Essential (primary) hypertension: Secondary | ICD-10-CM | POA: Diagnosis not present

## 2018-09-26 DIAGNOSIS — E1165 Type 2 diabetes mellitus with hyperglycemia: Secondary | ICD-10-CM | POA: Diagnosis not present

## 2018-09-26 DIAGNOSIS — I10 Essential (primary) hypertension: Secondary | ICD-10-CM | POA: Diagnosis not present

## 2018-09-26 DIAGNOSIS — E7849 Other hyperlipidemia: Secondary | ICD-10-CM | POA: Diagnosis not present

## 2018-09-26 DIAGNOSIS — M545 Low back pain: Secondary | ICD-10-CM | POA: Diagnosis not present

## 2018-10-28 DIAGNOSIS — E1165 Type 2 diabetes mellitus with hyperglycemia: Secondary | ICD-10-CM | POA: Diagnosis not present

## 2018-10-28 DIAGNOSIS — E7849 Other hyperlipidemia: Secondary | ICD-10-CM | POA: Diagnosis not present

## 2018-10-28 DIAGNOSIS — M545 Low back pain: Secondary | ICD-10-CM | POA: Diagnosis not present

## 2018-10-28 DIAGNOSIS — I10 Essential (primary) hypertension: Secondary | ICD-10-CM | POA: Diagnosis not present

## 2018-11-14 DIAGNOSIS — I10 Essential (primary) hypertension: Secondary | ICD-10-CM | POA: Diagnosis not present

## 2018-11-14 DIAGNOSIS — E7849 Other hyperlipidemia: Secondary | ICD-10-CM | POA: Diagnosis not present

## 2018-11-14 DIAGNOSIS — E1165 Type 2 diabetes mellitus with hyperglycemia: Secondary | ICD-10-CM | POA: Diagnosis not present

## 2018-11-14 DIAGNOSIS — M545 Low back pain: Secondary | ICD-10-CM | POA: Diagnosis not present

## 2018-11-18 DIAGNOSIS — E7849 Other hyperlipidemia: Secondary | ICD-10-CM | POA: Diagnosis not present

## 2018-11-18 DIAGNOSIS — M545 Low back pain: Secondary | ICD-10-CM | POA: Diagnosis not present

## 2018-11-18 DIAGNOSIS — E1165 Type 2 diabetes mellitus with hyperglycemia: Secondary | ICD-10-CM | POA: Diagnosis not present

## 2018-11-18 DIAGNOSIS — I10 Essential (primary) hypertension: Secondary | ICD-10-CM | POA: Diagnosis not present

## 2018-12-16 DIAGNOSIS — I10 Essential (primary) hypertension: Secondary | ICD-10-CM | POA: Diagnosis not present

## 2018-12-16 DIAGNOSIS — E7849 Other hyperlipidemia: Secondary | ICD-10-CM | POA: Diagnosis not present

## 2018-12-16 DIAGNOSIS — E1165 Type 2 diabetes mellitus with hyperglycemia: Secondary | ICD-10-CM | POA: Diagnosis not present

## 2018-12-16 DIAGNOSIS — M545 Low back pain: Secondary | ICD-10-CM | POA: Diagnosis not present

## 2019-01-14 DIAGNOSIS — E7849 Other hyperlipidemia: Secondary | ICD-10-CM | POA: Diagnosis not present

## 2019-01-14 DIAGNOSIS — E1165 Type 2 diabetes mellitus with hyperglycemia: Secondary | ICD-10-CM | POA: Diagnosis not present

## 2019-01-14 DIAGNOSIS — M545 Low back pain: Secondary | ICD-10-CM | POA: Diagnosis not present

## 2019-01-14 DIAGNOSIS — I10 Essential (primary) hypertension: Secondary | ICD-10-CM | POA: Diagnosis not present

## 2019-01-16 DIAGNOSIS — M545 Low back pain: Secondary | ICD-10-CM | POA: Diagnosis not present

## 2019-01-16 DIAGNOSIS — I10 Essential (primary) hypertension: Secondary | ICD-10-CM | POA: Diagnosis not present

## 2019-01-16 DIAGNOSIS — E7849 Other hyperlipidemia: Secondary | ICD-10-CM | POA: Diagnosis not present

## 2019-01-16 DIAGNOSIS — E1165 Type 2 diabetes mellitus with hyperglycemia: Secondary | ICD-10-CM | POA: Diagnosis not present

## 2019-01-16 DIAGNOSIS — Z6824 Body mass index (BMI) 24.0-24.9, adult: Secondary | ICD-10-CM | POA: Diagnosis not present

## 2019-03-10 IMAGING — RF DG ESOPHAGUS
8 of 12 series · 12 of 24 positions shown · non-contrast
Comparison: None

CLINICAL DATA: Dysphagia, coughing after swallowing solids and
year ago, history hypertension, diabetes mellitus, GERD

EXAM:
ESOPHOGRAM / BARIUM SWALLOW / BARIUM TABLET STUDY
TECHNIQUE: Combined double contrast and single contrast examination performed
using effervescent crystals, thick barium liquid, and thin barium
liquid. The patient was observed with fluoroscopy swallowing a 13 mm
barium sulphate tablet. Examination was terminated prematurely due
to findings, see below.
FLUOROSCOPY TIME:  Fluoroscopy Time:  1 minutes 18 seconds
Radiation Exposure Index (if provided by the fluoroscopic device):
24.8
Number of Acquired Spot Images: multiple fluoroscopic screen
captures

[Series 1: cp_standard · 0.17mm/px · 2 of 10 frames shown (1 of 8)]
[frame 6/10]
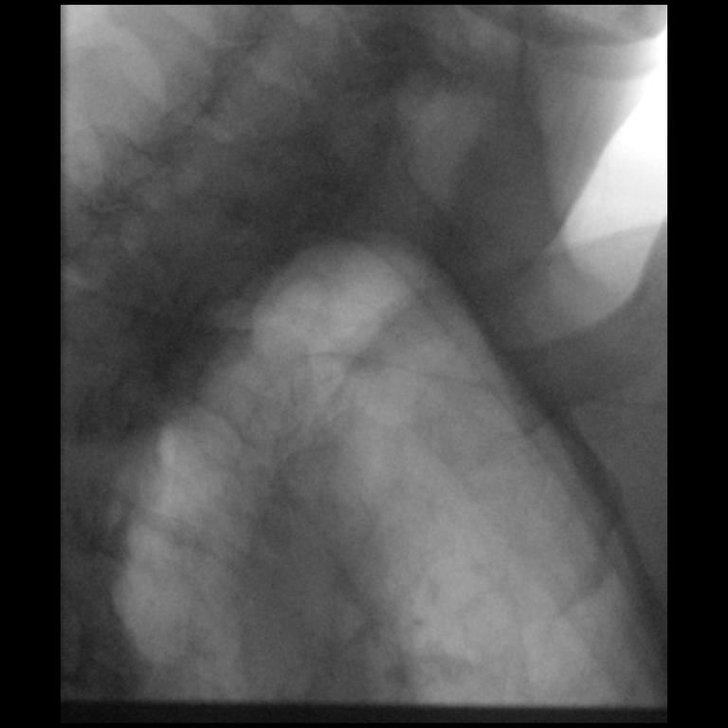
[frame 10/10]
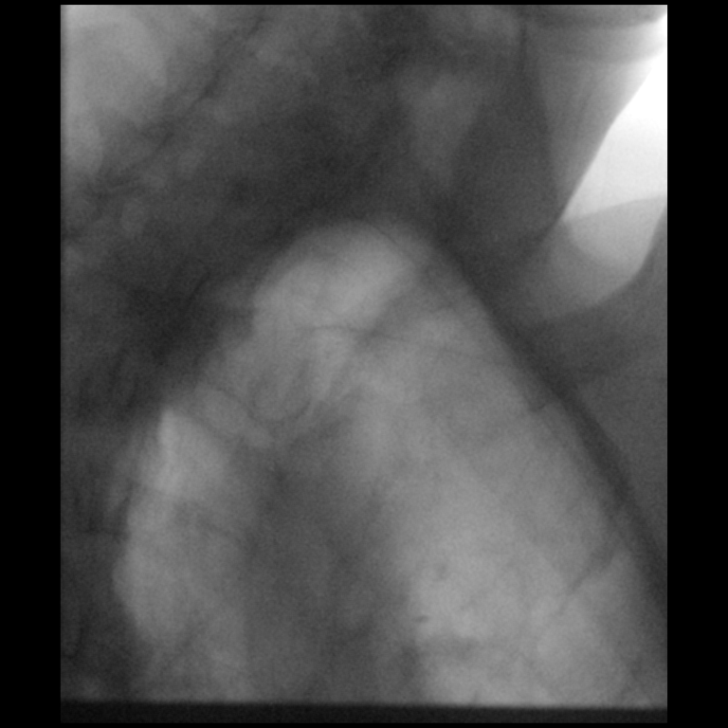

[Series 2: cp_standard · 0.17mm/px · 1 of 75 frames shown (2 of 8)]
[frame 38/75]
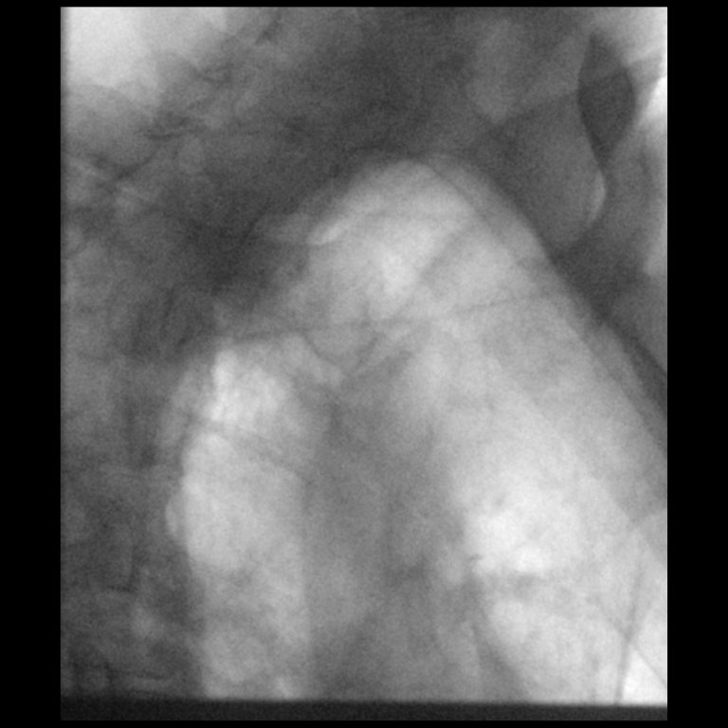

[Series 3: cp_standard · 0.18mm/px · 2 of 26 frames shown (3 of 8)]
[frame 1/26]
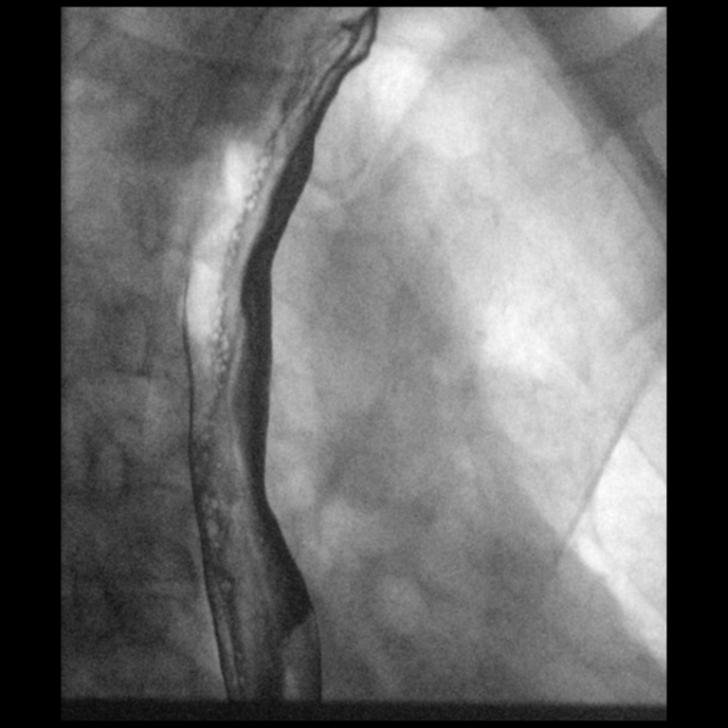
[frame 14/26]
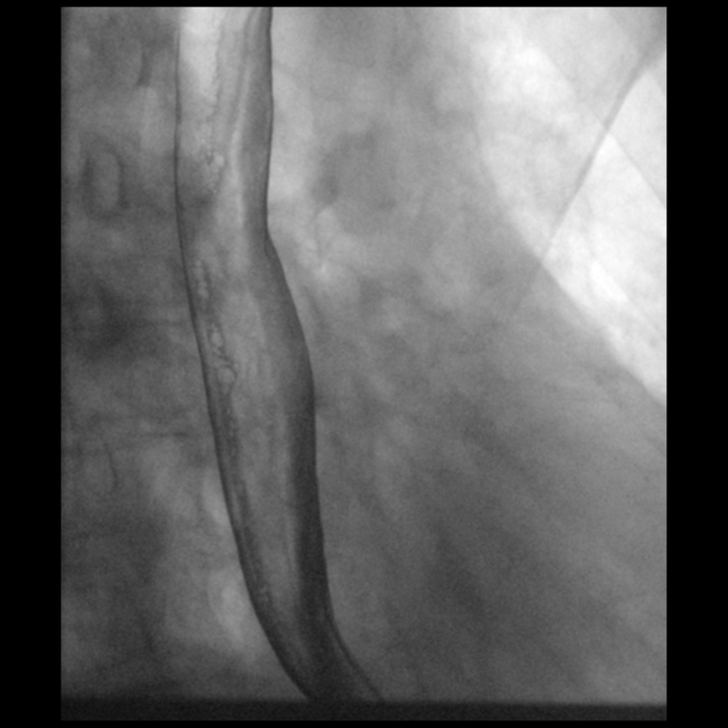

[Series 4: cp_standard · 0.18mm/px · 2 of 48 frames shown (4 of 8)]
[frame 8/48]
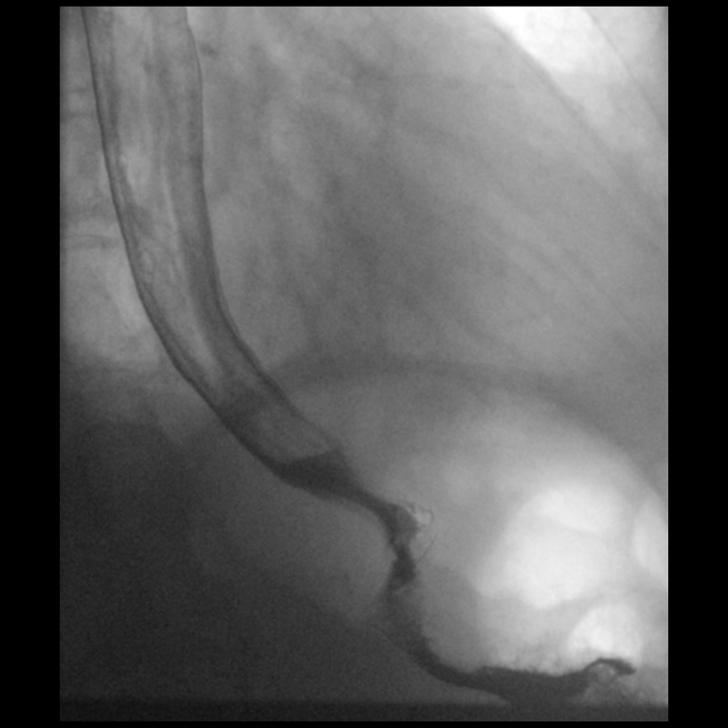
[frame 42/48]
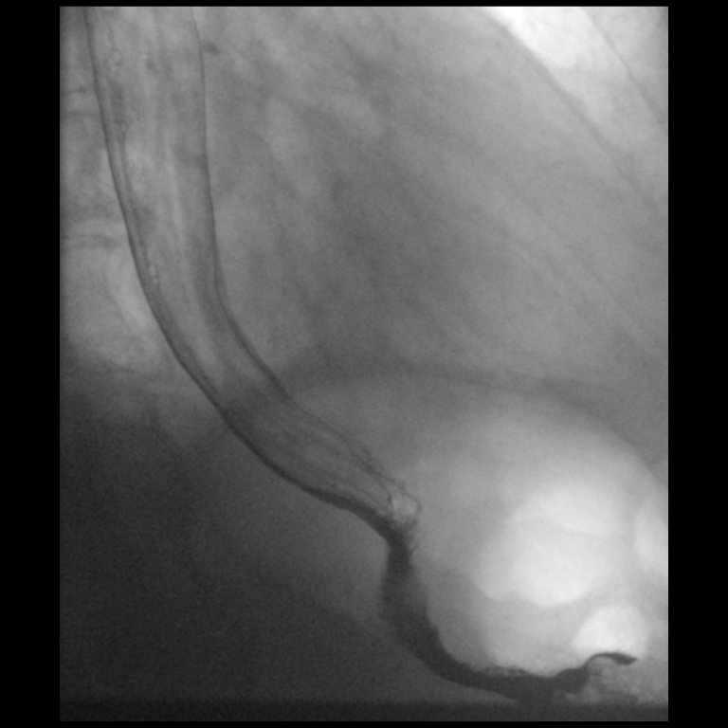

[Series 6: cp_standard · 0.17mm/px · 1 of 1 slices shown (5 of 8)]
[im 1/1]
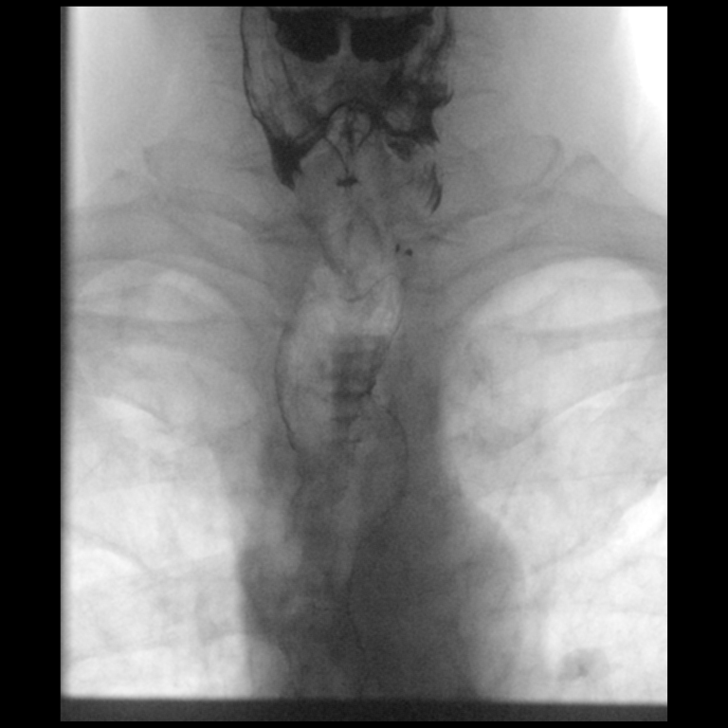

[Series 8: cp_standard · 0.26mm/px · 2 of 214 frames shown (6 of 8)]
[frame 33/214]
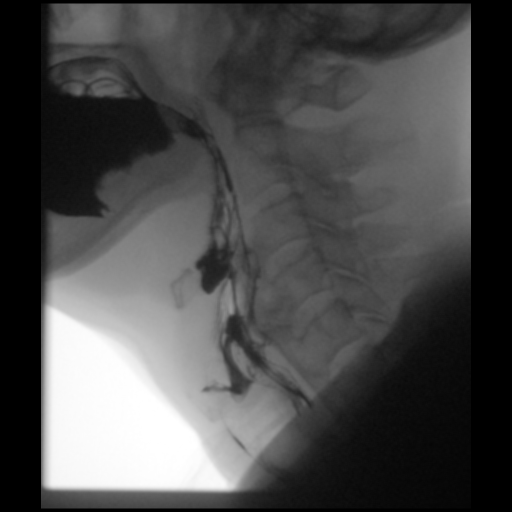
[frame 108/214]
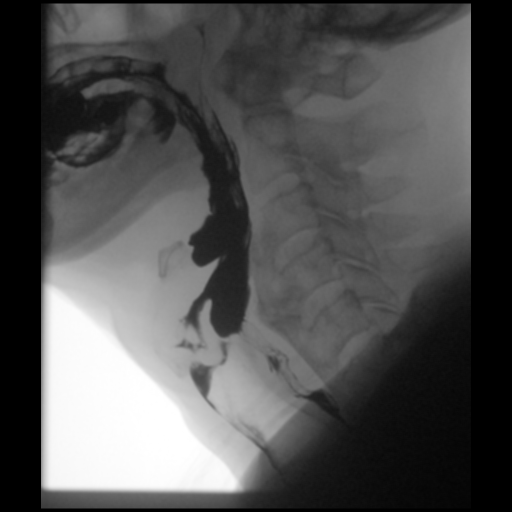

[Series 10: cp_standard · 0.26mm/px · 1 of 1 slices shown (7 of 8)]
[im 1/1]
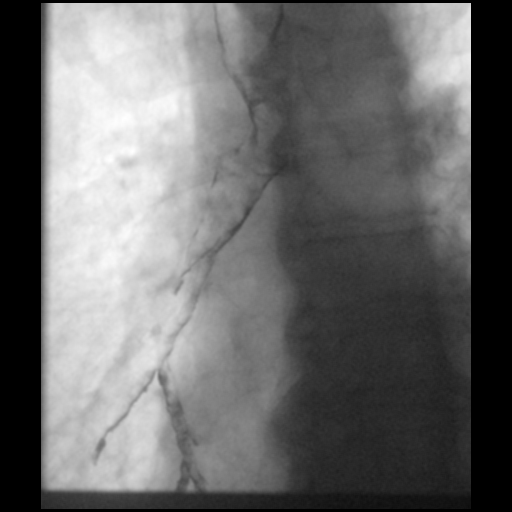

[Series 12: cp_standard · 0.25mm/px · 1 of 1 slices shown (8 of 8)]
[im 1/1]
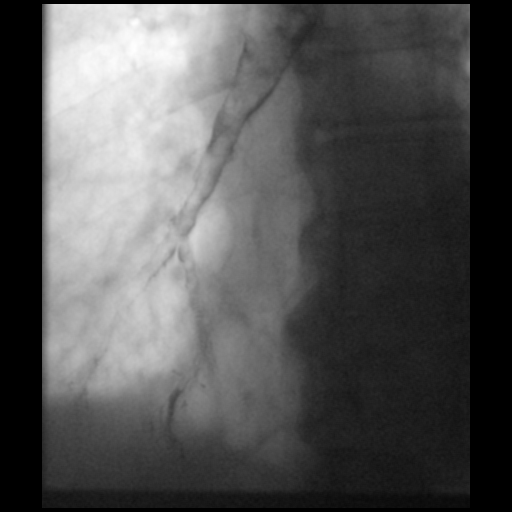

[12 of 24 positions shown; findings below may reference images not displayed]

FINDINGS: Esophageal distention: Grossly normal without mass or stricture

Filling defects:  None

12.5 mm barium tablet: Easily passed from oral cavity to stomach
without obstruction

Motility:  Grossly normal

Mucosa:  Smooth without obvious ulceration or irregularity

Hypopharynx/cervical esophagus: Failure of epiglottic inversion.
Significant laryngeal penetration with aspiration of contrast into
the trachea. Diminished and delayed spontaneous cough reflex, with
patient not spontaneously coughing until contrast at coursed down
the anterior wall of the trachea and into the RIGHT mainstem
bronchus. Poor clearance of aspirated contrast with coughing.

Hiatal hernia:  Not identified

GE reflux:  Not identified

Other:  N/A
IMPRESSION: Laryngeal penetration and significant aspiration of contrast into
the trachea, RIGHT mainstem bronchus and into RIGHT lower lobe.

Markedly delayed and diminished spontaneous cough reflex with poor
clearance of aspirated barium.

No esophageal stricture identified, with 12.5 mm diameter barium
tablet able to passed oral cavity without obstruction.

Findings called to Balaj Kummer NP on 04/15/2018 at 3697 hours.

## 2019-03-20 DIAGNOSIS — I10 Essential (primary) hypertension: Secondary | ICD-10-CM | POA: Diagnosis not present

## 2019-03-20 DIAGNOSIS — E782 Mixed hyperlipidemia: Secondary | ICD-10-CM | POA: Diagnosis not present

## 2019-03-20 DIAGNOSIS — M545 Low back pain: Secondary | ICD-10-CM | POA: Diagnosis not present

## 2019-03-20 DIAGNOSIS — E1165 Type 2 diabetes mellitus with hyperglycemia: Secondary | ICD-10-CM | POA: Diagnosis not present

## 2019-03-20 DIAGNOSIS — Z6824 Body mass index (BMI) 24.0-24.9, adult: Secondary | ICD-10-CM | POA: Diagnosis not present

## 2019-03-20 IMAGING — RF DG SWALLOWING FUNCTION
2 series · 2 of 2 positions shown · non-contrast
Comparison: Esophagram 04/15/2018

CLINICAL DATA: Aspiration on recent esophagram, denies history of
pneumonia

EXAM:
MODIFIED BARIUM SWALLOW
TECHNIQUE: Different consistencies of barium were administered orally to the
patient by the Speech Pathologist. Imaging of the pharynx was
performed in the lateral projection.
FLUOROSCOPY TIME:  Fluoroscopy Time:  3 minutes 24 seconds
Radiation Exposure Index (if provided by the fluoroscopic device):
30.1 mGy
Number of Acquired Spot Images: multiple fluoroscopic screen
captures

[Series 1: cp_standard · 0.17mm/px · 1 of 1 slices shown (1 of 2)]
[im 1/1]
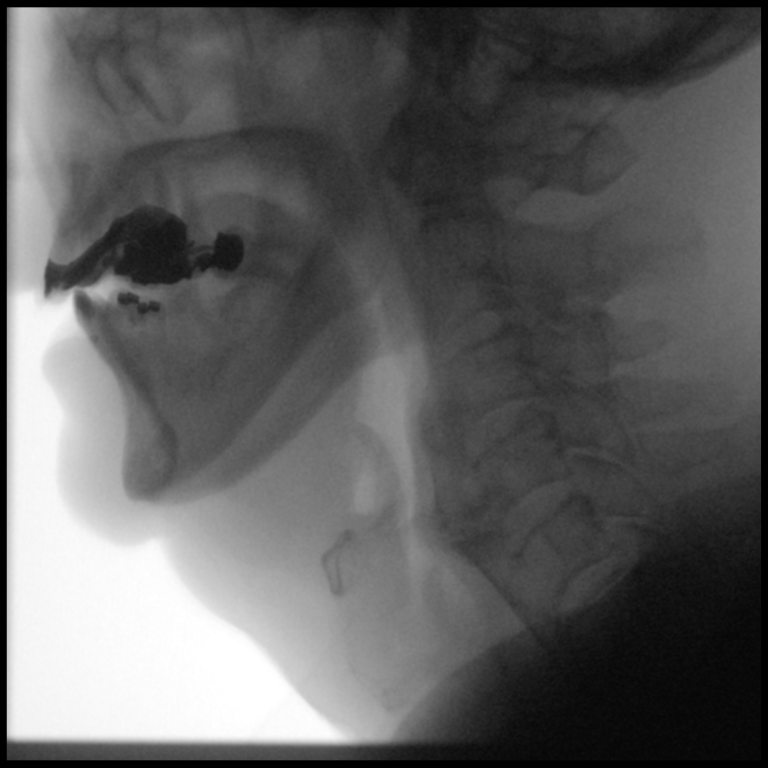

[Series 2: cp_standard · 0.26mm/px · 1 of 1 slices shown (2 of 2)]
[im 1/1]
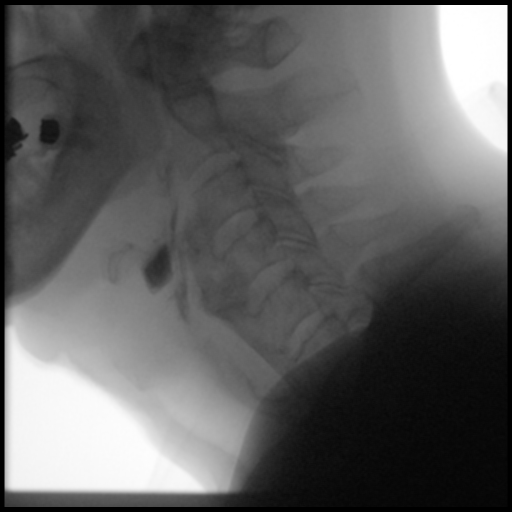

[2 of 2 positions shown; findings below may reference images not displayed]

FINDINGS: Thin liquid-failure of epiglottic inversion. Noted prominent spurs
and dense thick calcification of the anterior longitudinal ligament
of the cervical spine at C3-C5. Laryngeal penetration and aspiration
of contrast material into the proximal trachea. Delayed spontaneous
cough. Mild vallecular and piriform sinus residuals. Slightly less
aspiration occurred with holding breath prior to initiating swallow.

Nectar thick liquid-failure of epiglottic inversion. With sips by
cup, laryngeal penetration and aspiration of contrast were
identified. Vallecular residuals were noted. Penetration aspiration
persisted despite chin-tuck maneuver.

Honey-laryngeal penetration. Failure of epiglottic inversion.
Laryngeal penetration. No aspiration of contrast into the proximal
trachea, particularly from piriform sinus residuals. Vallecular and
piriform sinus residuals are present. With head turned to the RIGHT
no definite aspiration was seen no laryngeal penetration or
residuals persisted.

Pure-with apple sauce, flash laryngeal penetration occurred.
Vallecular and piriform sinus residuals. Aspiration of contrast
material from piriform sinus residual.

Cracker-vallecular residuals. No laryngeal penetration or
aspiration.

Pure with cracker-not evaluated

Barium tablet-not evaluated
IMPRESSION: Significant swallowing dysfunction as above.

## 2019-05-14 DIAGNOSIS — E782 Mixed hyperlipidemia: Secondary | ICD-10-CM | POA: Diagnosis not present

## 2019-05-14 DIAGNOSIS — M545 Low back pain: Secondary | ICD-10-CM | POA: Diagnosis not present

## 2019-05-14 DIAGNOSIS — I10 Essential (primary) hypertension: Secondary | ICD-10-CM | POA: Diagnosis not present

## 2019-05-14 DIAGNOSIS — E1165 Type 2 diabetes mellitus with hyperglycemia: Secondary | ICD-10-CM | POA: Diagnosis not present

## 2019-05-22 DIAGNOSIS — E1165 Type 2 diabetes mellitus with hyperglycemia: Secondary | ICD-10-CM | POA: Diagnosis not present

## 2019-05-22 DIAGNOSIS — E782 Mixed hyperlipidemia: Secondary | ICD-10-CM | POA: Diagnosis not present

## 2019-05-22 DIAGNOSIS — M545 Low back pain: Secondary | ICD-10-CM | POA: Diagnosis not present

## 2019-05-22 DIAGNOSIS — R1310 Dysphagia, unspecified: Secondary | ICD-10-CM | POA: Diagnosis not present

## 2019-05-22 DIAGNOSIS — I10 Essential (primary) hypertension: Secondary | ICD-10-CM | POA: Diagnosis not present

## 2019-06-06 DIAGNOSIS — E782 Mixed hyperlipidemia: Secondary | ICD-10-CM | POA: Diagnosis not present

## 2019-06-06 DIAGNOSIS — I10 Essential (primary) hypertension: Secondary | ICD-10-CM | POA: Diagnosis not present

## 2019-06-06 DIAGNOSIS — M545 Low back pain: Secondary | ICD-10-CM | POA: Diagnosis not present

## 2019-06-06 DIAGNOSIS — E1165 Type 2 diabetes mellitus with hyperglycemia: Secondary | ICD-10-CM | POA: Diagnosis not present

## 2019-07-08 DIAGNOSIS — M545 Low back pain: Secondary | ICD-10-CM | POA: Diagnosis not present

## 2019-07-08 DIAGNOSIS — I10 Essential (primary) hypertension: Secondary | ICD-10-CM | POA: Diagnosis not present

## 2019-07-08 DIAGNOSIS — E782 Mixed hyperlipidemia: Secondary | ICD-10-CM | POA: Diagnosis not present

## 2019-07-08 DIAGNOSIS — E1165 Type 2 diabetes mellitus with hyperglycemia: Secondary | ICD-10-CM | POA: Diagnosis not present

## 2019-07-24 DIAGNOSIS — Z0000 Encounter for general adult medical examination without abnormal findings: Secondary | ICD-10-CM | POA: Diagnosis not present

## 2019-07-24 DIAGNOSIS — M545 Low back pain: Secondary | ICD-10-CM | POA: Diagnosis not present

## 2019-07-24 DIAGNOSIS — Z1389 Encounter for screening for other disorder: Secondary | ICD-10-CM | POA: Diagnosis not present

## 2019-07-24 DIAGNOSIS — R1310 Dysphagia, unspecified: Secondary | ICD-10-CM | POA: Diagnosis not present

## 2019-07-24 DIAGNOSIS — E782 Mixed hyperlipidemia: Secondary | ICD-10-CM | POA: Diagnosis not present

## 2019-07-24 DIAGNOSIS — E1165 Type 2 diabetes mellitus with hyperglycemia: Secondary | ICD-10-CM | POA: Diagnosis not present

## 2019-07-24 DIAGNOSIS — I10 Essential (primary) hypertension: Secondary | ICD-10-CM | POA: Diagnosis not present

## 2019-08-12 DIAGNOSIS — I10 Essential (primary) hypertension: Secondary | ICD-10-CM | POA: Diagnosis not present

## 2019-08-12 DIAGNOSIS — E782 Mixed hyperlipidemia: Secondary | ICD-10-CM | POA: Diagnosis not present

## 2019-08-12 DIAGNOSIS — M545 Low back pain: Secondary | ICD-10-CM | POA: Diagnosis not present

## 2019-08-12 DIAGNOSIS — E1165 Type 2 diabetes mellitus with hyperglycemia: Secondary | ICD-10-CM | POA: Diagnosis not present

## 2019-09-16 DIAGNOSIS — E782 Mixed hyperlipidemia: Secondary | ICD-10-CM | POA: Diagnosis not present

## 2019-09-16 DIAGNOSIS — M545 Low back pain: Secondary | ICD-10-CM | POA: Diagnosis not present

## 2019-09-16 DIAGNOSIS — I10 Essential (primary) hypertension: Secondary | ICD-10-CM | POA: Diagnosis not present

## 2019-09-16 DIAGNOSIS — E1165 Type 2 diabetes mellitus with hyperglycemia: Secondary | ICD-10-CM | POA: Diagnosis not present

## 2019-09-25 DIAGNOSIS — Z0000 Encounter for general adult medical examination without abnormal findings: Secondary | ICD-10-CM | POA: Diagnosis not present

## 2019-09-25 DIAGNOSIS — I10 Essential (primary) hypertension: Secondary | ICD-10-CM | POA: Diagnosis not present

## 2019-09-25 DIAGNOSIS — M545 Low back pain: Secondary | ICD-10-CM | POA: Diagnosis not present

## 2019-09-25 DIAGNOSIS — R1310 Dysphagia, unspecified: Secondary | ICD-10-CM | POA: Diagnosis not present

## 2019-09-25 DIAGNOSIS — E1165 Type 2 diabetes mellitus with hyperglycemia: Secondary | ICD-10-CM | POA: Diagnosis not present

## 2019-09-25 DIAGNOSIS — E782 Mixed hyperlipidemia: Secondary | ICD-10-CM | POA: Diagnosis not present

## 2019-10-08 DIAGNOSIS — E782 Mixed hyperlipidemia: Secondary | ICD-10-CM | POA: Diagnosis not present

## 2019-10-08 DIAGNOSIS — E1165 Type 2 diabetes mellitus with hyperglycemia: Secondary | ICD-10-CM | POA: Diagnosis not present

## 2019-10-08 DIAGNOSIS — M545 Low back pain: Secondary | ICD-10-CM | POA: Diagnosis not present

## 2019-10-08 DIAGNOSIS — I10 Essential (primary) hypertension: Secondary | ICD-10-CM | POA: Diagnosis not present

## 2019-11-06 DIAGNOSIS — E1165 Type 2 diabetes mellitus with hyperglycemia: Secondary | ICD-10-CM | POA: Diagnosis not present

## 2019-11-06 DIAGNOSIS — I10 Essential (primary) hypertension: Secondary | ICD-10-CM | POA: Diagnosis not present

## 2019-11-06 DIAGNOSIS — M545 Low back pain: Secondary | ICD-10-CM | POA: Diagnosis not present

## 2019-11-06 DIAGNOSIS — E782 Mixed hyperlipidemia: Secondary | ICD-10-CM | POA: Diagnosis not present

## 2019-11-13 DIAGNOSIS — Z0000 Encounter for general adult medical examination without abnormal findings: Secondary | ICD-10-CM | POA: Diagnosis not present

## 2019-11-13 DIAGNOSIS — R1310 Dysphagia, unspecified: Secondary | ICD-10-CM | POA: Diagnosis not present

## 2019-11-13 DIAGNOSIS — E782 Mixed hyperlipidemia: Secondary | ICD-10-CM | POA: Diagnosis not present

## 2019-11-13 DIAGNOSIS — I10 Essential (primary) hypertension: Secondary | ICD-10-CM | POA: Diagnosis not present

## 2019-11-13 DIAGNOSIS — M545 Low back pain: Secondary | ICD-10-CM | POA: Diagnosis not present

## 2019-11-13 DIAGNOSIS — E1165 Type 2 diabetes mellitus with hyperglycemia: Secondary | ICD-10-CM | POA: Diagnosis not present

## 2019-12-19 DIAGNOSIS — I10 Essential (primary) hypertension: Secondary | ICD-10-CM | POA: Diagnosis not present

## 2019-12-19 DIAGNOSIS — M545 Low back pain: Secondary | ICD-10-CM | POA: Diagnosis not present

## 2019-12-19 DIAGNOSIS — E1165 Type 2 diabetes mellitus with hyperglycemia: Secondary | ICD-10-CM | POA: Diagnosis not present

## 2019-12-19 DIAGNOSIS — E782 Mixed hyperlipidemia: Secondary | ICD-10-CM | POA: Diagnosis not present

## 2020-01-19 DIAGNOSIS — I10 Essential (primary) hypertension: Secondary | ICD-10-CM | POA: Diagnosis not present

## 2020-01-19 DIAGNOSIS — R1310 Dysphagia, unspecified: Secondary | ICD-10-CM | POA: Diagnosis not present

## 2020-01-19 DIAGNOSIS — M545 Low back pain: Secondary | ICD-10-CM | POA: Diagnosis not present

## 2020-01-19 DIAGNOSIS — E782 Mixed hyperlipidemia: Secondary | ICD-10-CM | POA: Diagnosis not present

## 2020-01-19 DIAGNOSIS — E1165 Type 2 diabetes mellitus with hyperglycemia: Secondary | ICD-10-CM | POA: Diagnosis not present

## 2020-01-20 DIAGNOSIS — M545 Low back pain: Secondary | ICD-10-CM | POA: Diagnosis not present

## 2020-01-20 DIAGNOSIS — I10 Essential (primary) hypertension: Secondary | ICD-10-CM | POA: Diagnosis not present

## 2020-01-20 DIAGNOSIS — E782 Mixed hyperlipidemia: Secondary | ICD-10-CM | POA: Diagnosis not present

## 2020-01-20 DIAGNOSIS — E1165 Type 2 diabetes mellitus with hyperglycemia: Secondary | ICD-10-CM | POA: Diagnosis not present

## 2020-02-11 DIAGNOSIS — E782 Mixed hyperlipidemia: Secondary | ICD-10-CM | POA: Diagnosis not present

## 2020-02-11 DIAGNOSIS — M545 Low back pain: Secondary | ICD-10-CM | POA: Diagnosis not present

## 2020-02-11 DIAGNOSIS — E1165 Type 2 diabetes mellitus with hyperglycemia: Secondary | ICD-10-CM | POA: Diagnosis not present

## 2020-02-11 DIAGNOSIS — I10 Essential (primary) hypertension: Secondary | ICD-10-CM | POA: Diagnosis not present

## 2020-03-18 DIAGNOSIS — M545 Low back pain: Secondary | ICD-10-CM | POA: Diagnosis not present

## 2020-03-18 DIAGNOSIS — R1310 Dysphagia, unspecified: Secondary | ICD-10-CM | POA: Diagnosis not present

## 2020-03-18 DIAGNOSIS — E119 Type 2 diabetes mellitus without complications: Secondary | ICD-10-CM | POA: Diagnosis not present

## 2020-03-18 DIAGNOSIS — E782 Mixed hyperlipidemia: Secondary | ICD-10-CM | POA: Diagnosis not present

## 2020-03-18 DIAGNOSIS — I10 Essential (primary) hypertension: Secondary | ICD-10-CM | POA: Diagnosis not present

## 2020-03-22 DIAGNOSIS — E782 Mixed hyperlipidemia: Secondary | ICD-10-CM | POA: Diagnosis not present

## 2020-03-22 DIAGNOSIS — E119 Type 2 diabetes mellitus without complications: Secondary | ICD-10-CM | POA: Diagnosis not present

## 2020-03-22 DIAGNOSIS — I10 Essential (primary) hypertension: Secondary | ICD-10-CM | POA: Diagnosis not present

## 2020-03-22 DIAGNOSIS — M545 Low back pain: Secondary | ICD-10-CM | POA: Diagnosis not present

## 2020-04-19 DIAGNOSIS — M545 Low back pain, unspecified: Secondary | ICD-10-CM | POA: Diagnosis not present

## 2020-04-19 DIAGNOSIS — E782 Mixed hyperlipidemia: Secondary | ICD-10-CM | POA: Diagnosis not present

## 2020-04-19 DIAGNOSIS — I10 Essential (primary) hypertension: Secondary | ICD-10-CM | POA: Diagnosis not present

## 2020-04-19 DIAGNOSIS — E119 Type 2 diabetes mellitus without complications: Secondary | ICD-10-CM | POA: Diagnosis not present

## 2020-05-17 DIAGNOSIS — M545 Low back pain, unspecified: Secondary | ICD-10-CM | POA: Diagnosis not present

## 2020-05-17 DIAGNOSIS — E782 Mixed hyperlipidemia: Secondary | ICD-10-CM | POA: Diagnosis not present

## 2020-05-17 DIAGNOSIS — E119 Type 2 diabetes mellitus without complications: Secondary | ICD-10-CM | POA: Diagnosis not present

## 2020-05-17 DIAGNOSIS — I10 Essential (primary) hypertension: Secondary | ICD-10-CM | POA: Diagnosis not present

## 2020-05-18 DIAGNOSIS — I10 Essential (primary) hypertension: Secondary | ICD-10-CM | POA: Diagnosis not present

## 2020-05-18 DIAGNOSIS — E782 Mixed hyperlipidemia: Secondary | ICD-10-CM | POA: Diagnosis not present

## 2020-05-18 DIAGNOSIS — M545 Low back pain, unspecified: Secondary | ICD-10-CM | POA: Diagnosis not present

## 2020-05-18 DIAGNOSIS — R1310 Dysphagia, unspecified: Secondary | ICD-10-CM | POA: Diagnosis not present

## 2020-05-18 DIAGNOSIS — E119 Type 2 diabetes mellitus without complications: Secondary | ICD-10-CM | POA: Diagnosis not present

## 2020-06-14 DIAGNOSIS — E782 Mixed hyperlipidemia: Secondary | ICD-10-CM | POA: Diagnosis not present

## 2020-06-14 DIAGNOSIS — I10 Essential (primary) hypertension: Secondary | ICD-10-CM | POA: Diagnosis not present

## 2020-06-14 DIAGNOSIS — E119 Type 2 diabetes mellitus without complications: Secondary | ICD-10-CM | POA: Diagnosis not present

## 2020-06-14 DIAGNOSIS — M545 Low back pain, unspecified: Secondary | ICD-10-CM | POA: Diagnosis not present

## 2020-07-20 DIAGNOSIS — E782 Mixed hyperlipidemia: Secondary | ICD-10-CM | POA: Diagnosis not present

## 2020-07-20 DIAGNOSIS — I10 Essential (primary) hypertension: Secondary | ICD-10-CM | POA: Diagnosis not present

## 2020-07-20 DIAGNOSIS — E1165 Type 2 diabetes mellitus with hyperglycemia: Secondary | ICD-10-CM | POA: Diagnosis not present

## 2020-07-20 DIAGNOSIS — M545 Low back pain, unspecified: Secondary | ICD-10-CM | POA: Diagnosis not present

## 2020-07-20 DIAGNOSIS — R1310 Dysphagia, unspecified: Secondary | ICD-10-CM | POA: Diagnosis not present

## 2020-09-29 DIAGNOSIS — E782 Mixed hyperlipidemia: Secondary | ICD-10-CM | POA: Diagnosis not present

## 2020-09-29 DIAGNOSIS — R1310 Dysphagia, unspecified: Secondary | ICD-10-CM | POA: Diagnosis not present

## 2020-09-29 DIAGNOSIS — Z6824 Body mass index (BMI) 24.0-24.9, adult: Secondary | ICD-10-CM | POA: Diagnosis not present

## 2020-09-29 DIAGNOSIS — I10 Essential (primary) hypertension: Secondary | ICD-10-CM | POA: Diagnosis not present

## 2020-09-29 DIAGNOSIS — M545 Low back pain, unspecified: Secondary | ICD-10-CM | POA: Diagnosis not present

## 2020-09-29 DIAGNOSIS — E1165 Type 2 diabetes mellitus with hyperglycemia: Secondary | ICD-10-CM | POA: Diagnosis not present

## 2020-09-29 DIAGNOSIS — Z0000 Encounter for general adult medical examination without abnormal findings: Secondary | ICD-10-CM | POA: Diagnosis not present

## 2020-11-09 DIAGNOSIS — W19XXXA Unspecified fall, initial encounter: Secondary | ICD-10-CM | POA: Diagnosis not present

## 2020-11-09 DIAGNOSIS — S42031A Displaced fracture of lateral end of right clavicle, initial encounter for closed fracture: Secondary | ICD-10-CM | POA: Diagnosis not present

## 2020-11-09 DIAGNOSIS — S42001A Fracture of unspecified part of right clavicle, initial encounter for closed fracture: Secondary | ICD-10-CM | POA: Diagnosis not present

## 2020-11-09 DIAGNOSIS — M810 Age-related osteoporosis without current pathological fracture: Secondary | ICD-10-CM | POA: Diagnosis not present

## 2020-11-12 DIAGNOSIS — M898X1 Other specified disorders of bone, shoulder: Secondary | ICD-10-CM | POA: Diagnosis not present

## 2020-11-12 DIAGNOSIS — S42031A Displaced fracture of lateral end of right clavicle, initial encounter for closed fracture: Secondary | ICD-10-CM | POA: Diagnosis not present

## 2020-11-25 DIAGNOSIS — I739 Peripheral vascular disease, unspecified: Secondary | ICD-10-CM | POA: Diagnosis not present

## 2020-11-26 DIAGNOSIS — S42031D Displaced fracture of lateral end of right clavicle, subsequent encounter for fracture with routine healing: Secondary | ICD-10-CM | POA: Diagnosis not present

## 2020-12-04 DIAGNOSIS — J9811 Atelectasis: Secondary | ICD-10-CM | POA: Diagnosis not present

## 2020-12-04 DIAGNOSIS — Z20822 Contact with and (suspected) exposure to covid-19: Secondary | ICD-10-CM | POA: Diagnosis not present

## 2020-12-04 DIAGNOSIS — G459 Transient cerebral ischemic attack, unspecified: Secondary | ICD-10-CM | POA: Diagnosis not present

## 2020-12-04 DIAGNOSIS — J982 Interstitial emphysema: Secondary | ICD-10-CM | POA: Diagnosis not present

## 2020-12-04 DIAGNOSIS — I2510 Atherosclerotic heart disease of native coronary artery without angina pectoris: Secondary | ICD-10-CM | POA: Diagnosis not present

## 2020-12-04 DIAGNOSIS — R0602 Shortness of breath: Secondary | ICD-10-CM | POA: Diagnosis not present

## 2020-12-04 DIAGNOSIS — I6523 Occlusion and stenosis of bilateral carotid arteries: Secondary | ICD-10-CM | POA: Diagnosis not present

## 2020-12-04 DIAGNOSIS — I6503 Occlusion and stenosis of bilateral vertebral arteries: Secondary | ICD-10-CM | POA: Diagnosis not present

## 2020-12-04 DIAGNOSIS — R059 Cough, unspecified: Secondary | ICD-10-CM | POA: Diagnosis not present

## 2020-12-04 DIAGNOSIS — R519 Headache, unspecified: Secondary | ICD-10-CM | POA: Diagnosis not present

## 2020-12-04 DIAGNOSIS — I444 Left anterior fascicular block: Secondary | ICD-10-CM | POA: Diagnosis not present

## 2020-12-04 DIAGNOSIS — I63233 Cerebral infarction due to unspecified occlusion or stenosis of bilateral carotid arteries: Secondary | ICD-10-CM | POA: Diagnosis not present

## 2020-12-04 DIAGNOSIS — Z7982 Long term (current) use of aspirin: Secondary | ICD-10-CM | POA: Diagnosis not present

## 2020-12-04 DIAGNOSIS — I672 Cerebral atherosclerosis: Secondary | ICD-10-CM | POA: Diagnosis not present

## 2020-12-04 DIAGNOSIS — Z8673 Personal history of transient ischemic attack (TIA), and cerebral infarction without residual deficits: Secondary | ICD-10-CM | POA: Diagnosis not present

## 2020-12-04 DIAGNOSIS — I63213 Cerebral infarction due to unspecified occlusion or stenosis of bilateral vertebral arteries: Secondary | ICD-10-CM | POA: Diagnosis not present

## 2020-12-04 DIAGNOSIS — R42 Dizziness and giddiness: Secondary | ICD-10-CM | POA: Diagnosis not present

## 2020-12-04 DIAGNOSIS — I700 Atherosclerosis of aorta: Secondary | ICD-10-CM | POA: Diagnosis not present

## 2020-12-04 DIAGNOSIS — R531 Weakness: Secondary | ICD-10-CM | POA: Diagnosis not present

## 2020-12-05 DIAGNOSIS — R42 Dizziness and giddiness: Secondary | ICD-10-CM | POA: Diagnosis not present

## 2020-12-05 DIAGNOSIS — R519 Headache, unspecified: Secondary | ICD-10-CM | POA: Diagnosis not present

## 2020-12-07 DIAGNOSIS — R1310 Dysphagia, unspecified: Secondary | ICD-10-CM | POA: Diagnosis not present

## 2020-12-07 DIAGNOSIS — M545 Low back pain, unspecified: Secondary | ICD-10-CM | POA: Diagnosis not present

## 2020-12-07 DIAGNOSIS — E1165 Type 2 diabetes mellitus with hyperglycemia: Secondary | ICD-10-CM | POA: Diagnosis not present

## 2020-12-07 DIAGNOSIS — Z6823 Body mass index (BMI) 23.0-23.9, adult: Secondary | ICD-10-CM | POA: Diagnosis not present

## 2020-12-07 DIAGNOSIS — I10 Essential (primary) hypertension: Secondary | ICD-10-CM | POA: Diagnosis not present

## 2020-12-07 DIAGNOSIS — E782 Mixed hyperlipidemia: Secondary | ICD-10-CM | POA: Diagnosis not present

## 2020-12-10 DIAGNOSIS — S42031D Displaced fracture of lateral end of right clavicle, subsequent encounter for fracture with routine healing: Secondary | ICD-10-CM | POA: Diagnosis not present

## 2020-12-13 DIAGNOSIS — R0602 Shortness of breath: Secondary | ICD-10-CM | POA: Diagnosis not present

## 2020-12-13 DIAGNOSIS — J982 Interstitial emphysema: Secondary | ICD-10-CM | POA: Diagnosis not present

## 2020-12-22 DIAGNOSIS — S42031A Displaced fracture of lateral end of right clavicle, initial encounter for closed fracture: Secondary | ICD-10-CM | POA: Diagnosis not present

## 2020-12-22 DIAGNOSIS — Z7409 Other reduced mobility: Secondary | ICD-10-CM | POA: Diagnosis not present

## 2020-12-27 DIAGNOSIS — S42031A Displaced fracture of lateral end of right clavicle, initial encounter for closed fracture: Secondary | ICD-10-CM | POA: Diagnosis not present

## 2020-12-27 DIAGNOSIS — Z7409 Other reduced mobility: Secondary | ICD-10-CM | POA: Diagnosis not present

## 2020-12-29 DIAGNOSIS — E782 Mixed hyperlipidemia: Secondary | ICD-10-CM | POA: Diagnosis not present

## 2020-12-29 DIAGNOSIS — Z0000 Encounter for general adult medical examination without abnormal findings: Secondary | ICD-10-CM | POA: Diagnosis not present

## 2020-12-29 DIAGNOSIS — E1165 Type 2 diabetes mellitus with hyperglycemia: Secondary | ICD-10-CM | POA: Diagnosis not present

## 2020-12-29 DIAGNOSIS — R0602 Shortness of breath: Secondary | ICD-10-CM | POA: Diagnosis not present

## 2020-12-29 DIAGNOSIS — R1310 Dysphagia, unspecified: Secondary | ICD-10-CM | POA: Diagnosis not present

## 2020-12-29 DIAGNOSIS — I10 Essential (primary) hypertension: Secondary | ICD-10-CM | POA: Diagnosis not present

## 2020-12-29 DIAGNOSIS — R5383 Other fatigue: Secondary | ICD-10-CM | POA: Diagnosis not present

## 2020-12-29 DIAGNOSIS — S42031A Displaced fracture of lateral end of right clavicle, initial encounter for closed fracture: Secondary | ICD-10-CM | POA: Diagnosis not present

## 2020-12-29 DIAGNOSIS — M545 Low back pain, unspecified: Secondary | ICD-10-CM | POA: Diagnosis not present

## 2020-12-29 DIAGNOSIS — Z7409 Other reduced mobility: Secondary | ICD-10-CM | POA: Diagnosis not present

## 2020-12-29 DIAGNOSIS — Z6823 Body mass index (BMI) 23.0-23.9, adult: Secondary | ICD-10-CM | POA: Diagnosis not present

## 2020-12-30 DIAGNOSIS — R0602 Shortness of breath: Secondary | ICD-10-CM | POA: Diagnosis not present

## 2020-12-30 DIAGNOSIS — J90 Pleural effusion, not elsewhere classified: Secondary | ICD-10-CM | POA: Diagnosis not present

## 2021-01-04 DIAGNOSIS — S42031A Displaced fracture of lateral end of right clavicle, initial encounter for closed fracture: Secondary | ICD-10-CM | POA: Diagnosis not present

## 2021-01-04 DIAGNOSIS — Z7409 Other reduced mobility: Secondary | ICD-10-CM | POA: Diagnosis not present

## 2021-01-06 DIAGNOSIS — Z7409 Other reduced mobility: Secondary | ICD-10-CM | POA: Diagnosis not present

## 2021-01-06 DIAGNOSIS — S42031A Displaced fracture of lateral end of right clavicle, initial encounter for closed fracture: Secondary | ICD-10-CM | POA: Diagnosis not present

## 2021-01-07 DIAGNOSIS — S42031D Displaced fracture of lateral end of right clavicle, subsequent encounter for fracture with routine healing: Secondary | ICD-10-CM | POA: Diagnosis not present

## 2021-01-08 DIAGNOSIS — H40033 Anatomical narrow angle, bilateral: Secondary | ICD-10-CM | POA: Diagnosis not present

## 2021-01-08 DIAGNOSIS — E119 Type 2 diabetes mellitus without complications: Secondary | ICD-10-CM | POA: Diagnosis not present

## 2021-01-10 DIAGNOSIS — T797XXA Traumatic subcutaneous emphysema, initial encounter: Secondary | ICD-10-CM | POA: Diagnosis not present

## 2021-01-10 DIAGNOSIS — S42031A Displaced fracture of lateral end of right clavicle, initial encounter for closed fracture: Secondary | ICD-10-CM | POA: Diagnosis not present

## 2021-01-10 DIAGNOSIS — Z6823 Body mass index (BMI) 23.0-23.9, adult: Secondary | ICD-10-CM | POA: Diagnosis not present

## 2021-01-10 DIAGNOSIS — Z7409 Other reduced mobility: Secondary | ICD-10-CM | POA: Diagnosis not present

## 2021-01-12 DIAGNOSIS — S42031A Displaced fracture of lateral end of right clavicle, initial encounter for closed fracture: Secondary | ICD-10-CM | POA: Diagnosis not present

## 2021-01-12 DIAGNOSIS — Z7409 Other reduced mobility: Secondary | ICD-10-CM | POA: Diagnosis not present

## 2021-02-25 ENCOUNTER — Encounter: Payer: Self-pay | Admitting: Pulmonary Disease

## 2021-02-25 ENCOUNTER — Other Ambulatory Visit: Payer: Self-pay

## 2021-02-25 ENCOUNTER — Ambulatory Visit: Payer: Medicare Other | Admitting: Pulmonary Disease

## 2021-02-25 VITALS — BP 138/84 | HR 72 | Temp 97.0°F | Ht 73.0 in | Wt 181.0 lb

## 2021-02-25 DIAGNOSIS — J849 Interstitial pulmonary disease, unspecified: Secondary | ICD-10-CM
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
## 2021-02-25 NOTE — Patient Instructions (Signed)
Will have you sign a release form to get a disc with the images of your CT chest scan from [facility] done in June 2022  Lab work today  Will arrange for pulmonary function test in [place] office  Follow up in 4 weeks in [place] office with Dr. Halford Chessman or Nurse Practitoner

## 2021-03-01 LAB — CBC WITH DIFFERENTIAL/PLATELET
Basophils Absolute: 0.1 10*3/uL (ref 0.0–0.2)
Basos: 1 %
EOS (ABSOLUTE): 0.3 10*3/uL (ref 0.0–0.4)
Eos: 2 %
Hematocrit: 42.3 % (ref 37.5–51.0)
Hemoglobin: 14.1 g/dL (ref 13.0–17.7)
Immature Grans (Abs): 0 10*3/uL (ref 0.0–0.1)
Immature Granulocytes: 0 %
Lymphocytes Absolute: 2.6 10*3/uL (ref 0.7–3.1)
Lymphs: 20 %
MCH: 31.8 pg (ref 26.6–33.0)
MCHC: 33.3 g/dL (ref 31.5–35.7)
MCV: 95 fL (ref 79–97)
Monocytes Absolute: 1.4 10*3/uL — ABNORMAL HIGH (ref 0.1–0.9)
Monocytes: 11 %
Neutrophils Absolute: 8.5 10*3/uL — ABNORMAL HIGH (ref 1.4–7.0)
Neutrophils: 66 %
Platelets: 295 10*3/uL (ref 150–450)
RBC: 4.44 x10E6/uL (ref 4.14–5.80)
RDW: 12.8 % (ref 11.6–15.4)
WBC: 12.8 10*3/uL — ABNORMAL HIGH (ref 3.4–10.8)

## 2021-03-01 LAB — ANCA PROFILE
Anti-MPO Antibodies: <0.2 units (ref 0.0–0.9)
Anti-PR3 Antibodies: <0.2 units (ref 0.0–0.9)
Atypical pANCA: <1:20 {titer}
C-ANCA: <1:20 {titer}
P-ANCA: <1:20 {titer}

## 2021-03-01 LAB — COMPREHENSIVE METABOLIC PANEL
ALT: 10 IU/L (ref 0–44)
AST: 14 IU/L (ref 0–40)
Albumin/Globulin Ratio: 1.3 (ref 1.2–2.2)
Albumin: 4.3 g/dL (ref 3.8–4.8)
Alkaline Phosphatase: 92 IU/L (ref 44–121)
BUN/Creatinine Ratio: 15 (ref 10–24)
BUN: 15 mg/dL (ref 8–27)
Bilirubin Total: 0.8 mg/dL (ref 0.0–1.2)
CO2: 24 mmol/L (ref 20–29)
Calcium: 9.7 mg/dL (ref 8.6–10.2)
Chloride: 101 mmol/L (ref 96–106)
Creatinine, Ser: 1 mg/dL (ref 0.76–1.27)
Globulin, Total: 3.2 g/dL (ref 1.5–4.5)
Glucose: 142 mg/dL — ABNORMAL HIGH (ref 65–99)
Potassium: 5 mmol/L (ref 3.5–5.2)
Sodium: 140 mmol/L (ref 134–144)
Total Protein: 7.5 g/dL (ref 6.0–8.5)
eGFR: 81 mL/min/{1.73_m2} (ref >59)

## 2021-03-01 LAB — ANA W/REFLEX IF POSITIVE: Anti Nuclear Antibody (ANA): NEGATIVE

## 2021-03-01 LAB — SEDIMENTATION RATE: Sed Rate: 14 mm/hr (ref 0–30)

## 2021-03-01 LAB — ALDOLASE: Aldolase: 2.9 U/L — ABNORMAL LOW (ref 3.3–10.3)

## 2021-03-01 LAB — RHEUMATOID FACTOR: Rheumatoid fact SerPl-aCnc: <10 IU/mL (ref <14.0)

## 2021-03-01 LAB — ANTI-SCLERODERMA ANTIBODY: Scleroderma (Scl-70) (ENA) Antibody, IgG: <0.2 AI (ref 0.0–0.9)

## 2021-03-15 ENCOUNTER — Encounter: Payer: Self-pay | Admitting: Pulmonary Disease

## 2021-03-18 ENCOUNTER — Other Ambulatory Visit: Payer: Self-pay | Admitting: Pulmonary Disease

## 2021-03-18 ENCOUNTER — Telehealth: Payer: Self-pay | Admitting: Pulmonary Disease

## 2021-03-18 NOTE — Telephone Encounter (Signed)
Dylan Mires, MD  03/02/2021  1:37 PM EDT     Please let him know his lab tests don't show any evidence for a specific cause of his lung scarring.  Will discuss in more detail at his next ROV.  Spoke with Spouse ok per DPR and notified of results and she verbalized understanding and nothing further needed.

## 2021-03-25 ENCOUNTER — Ambulatory Visit (INDEPENDENT_AMBULATORY_CARE_PROVIDER_SITE_OTHER): Payer: Medicare Other | Admitting: Pulmonary Disease

## 2021-03-25 ENCOUNTER — Encounter: Payer: Self-pay | Admitting: Adult Health

## 2021-03-25 ENCOUNTER — Other Ambulatory Visit: Payer: Self-pay

## 2021-03-25 ENCOUNTER — Ambulatory Visit (INDEPENDENT_AMBULATORY_CARE_PROVIDER_SITE_OTHER): Payer: Medicare Other | Admitting: Adult Health

## 2021-03-25 VITALS — BP 126/72 | HR 67 | Temp 97.8°F | Ht 73.0 in | Wt 179.0 lb

## 2021-03-25 DIAGNOSIS — J849 Interstitial pulmonary disease, unspecified: Secondary | ICD-10-CM | POA: Diagnosis not present

## 2021-03-25 DIAGNOSIS — R1319 Other dysphagia: Secondary | ICD-10-CM | POA: Diagnosis not present

## 2021-03-25 LAB — PULMONARY FUNCTION TEST
DL/VA % pred: 134 %
DL/VA: 5.39 ml/min/mmHg/L
DLCO cor % pred: 77 %
DLCO cor: 22.09 ml/min/mmHg
DLCO unc % pred: 76 %
DLCO unc: 21.78 ml/min/mmHg
RV % pred: 107 %
RV: 2.8 L
TLC % pred: 77 %
TLC: 5.95 L

## 2021-03-25 MED ORDER — ALBUTEROL SULFATE HFA 108 (90 BASE) MCG/ACT IN AERS: 1.0000 | INHALATION_SPRAY | Freq: Four times a day (QID) | RESPIRATORY_TRACT | 2 refills | Status: AC | PRN

## 2021-03-25 NOTE — Progress Notes (Signed)
Patient was not able to complete spirometry with back extrapolation. DLCO and Pleth performed.

## 2021-03-25 NOTE — Patient Instructions (Addendum)
HRCT Chest in December 2022 Activity as tolerated.  Call back if you change your mind on Pulmonary rehab.  Aspirations precautions .  Albuterol inhaler 1-2 puffs every 6hrs as needed  Follow up with Dr. Halford Chessman  in [place] office in 3 months and As needed
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
## 2021-03-25 NOTE — Patient Instructions (Addendum)
Patient was unable to perform spirometry with back extrapolation. DLCO and Pleth performed.

## 2021-03-29 DIAGNOSIS — J849 Interstitial pulmonary disease, unspecified: Secondary | ICD-10-CM | POA: Insufficient documentation

## 2021-03-29 NOTE — Assessment & Plan Note (Signed)
CT chest compatible with ILD.-Autoimmune/connective tissue profile is negative.  Patient has extensive occupational exposure along with recurrent aspiration/dysphagia. We discussed his pulmonary function testing and previous CT results.  Patient wants to hold off on pulmonary rehab at this time.  Advised on activity as tolerated. Will check high-resolution CT chest in 3 months.  Plan  Patient Instructions  HRCT Chest in December 2022 Activity as tolerated.  Call back if you change your mind on Pulmonary rehab.  Aspirations precautions .  Albuterol inhaler 1-2 puffs every 6hrs as needed  Follow up with Dr. Halford Chessman  in [place] office in 3 months and As needed

## 2021-03-29 NOTE — Assessment & Plan Note (Signed)
Aspiration precautions 

## 2021-03-30 NOTE — Progress Notes (Signed)
Reviewed and agree with assessment/plan.   Chesley Mires, MD [facility] Pulmonary/Critical Care 03/30/2021, 8:38 AM Pager:  [phone]

## 2021-03-31 DIAGNOSIS — J849 Interstitial pulmonary disease, unspecified: Secondary | ICD-10-CM | POA: Diagnosis not present

## 2021-03-31 DIAGNOSIS — Z6823 Body mass index (BMI) 23.0-23.9, adult: Secondary | ICD-10-CM | POA: Diagnosis not present

## 2021-03-31 DIAGNOSIS — E038 Other specified hypothyroidism: Secondary | ICD-10-CM | POA: Diagnosis not present

## 2021-03-31 DIAGNOSIS — E1169 Type 2 diabetes mellitus with other specified complication: Secondary | ICD-10-CM | POA: Diagnosis not present

## 2021-03-31 DIAGNOSIS — E7849 Other hyperlipidemia: Secondary | ICD-10-CM | POA: Diagnosis not present

## 2021-03-31 DIAGNOSIS — M545 Low back pain, unspecified: Secondary | ICD-10-CM | POA: Diagnosis not present

## 2021-04-25 DIAGNOSIS — M810 Age-related osteoporosis without current pathological fracture: Secondary | ICD-10-CM | POA: Diagnosis not present

## 2021-04-25 DIAGNOSIS — M8589 Other specified disorders of bone density and structure, multiple sites: Secondary | ICD-10-CM | POA: Diagnosis not present

## 2021-06-21 ENCOUNTER — Ambulatory Visit (HOSPITAL_COMMUNITY)
Admission: RE | Admit: 2021-06-21 | Discharge: 2021-06-21 | Disposition: A | Discharge status: Home / Self Care | Payer: Medicare Other | Source: Ambulatory Visit | Attending: Adult Health | Admitting: Adult Health

## 2021-06-21 ENCOUNTER — Other Ambulatory Visit: Payer: Self-pay

## 2021-06-21 DIAGNOSIS — J84112 Idiopathic pulmonary fibrosis: Secondary | ICD-10-CM | POA: Diagnosis not present

## 2021-06-21 DIAGNOSIS — J479 Bronchiectasis, uncomplicated: Secondary | ICD-10-CM | POA: Diagnosis not present

## 2021-06-21 DIAGNOSIS — I2510 Atherosclerotic heart disease of native coronary artery without angina pectoris: Secondary | ICD-10-CM | POA: Diagnosis not present

## 2021-06-21 DIAGNOSIS — J849 Interstitial pulmonary disease, unspecified: Secondary | ICD-10-CM | POA: Insufficient documentation

## 2021-06-21 DIAGNOSIS — M5134 Other intervertebral disc degeneration, thoracic region: Secondary | ICD-10-CM | POA: Diagnosis not present

## 2021-06-24 ENCOUNTER — Other Ambulatory Visit: Payer: Self-pay

## 2021-06-24 ENCOUNTER — Encounter: Payer: Self-pay | Admitting: Pulmonary Disease

## 2021-06-24 ENCOUNTER — Ambulatory Visit: Payer: Medicare Other | Admitting: Pulmonary Disease

## 2021-06-24 VITALS — BP 134/78 | HR 66 | Temp 98.2°F | Ht 73.0 in | Wt 172.1 lb

## 2021-06-24 DIAGNOSIS — J84112 Idiopathic pulmonary fibrosis: Secondary | ICD-10-CM | POA: Diagnosis not present
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
## 2021-06-24 NOTE — Patient Instructions (Signed)
Will start the approval process to begin Ofev for pulmonary fibrosis  Will arrange for an appointment with Dr. Chase Caller or Dr. Vaughan Browner in our [place] office to review additional treatment and research options for pulmonary fibrosis  Follow up with Dr. Halford Chessman in [place] office in 4 to 5 month

## 2021-06-30 DIAGNOSIS — M545 Low back pain, unspecified: Secondary | ICD-10-CM | POA: Diagnosis not present

## 2021-06-30 DIAGNOSIS — E038 Other specified hypothyroidism: Secondary | ICD-10-CM | POA: Diagnosis not present

## 2021-06-30 DIAGNOSIS — E1169 Type 2 diabetes mellitus with other specified complication: Secondary | ICD-10-CM | POA: Diagnosis not present

## 2021-06-30 DIAGNOSIS — E7849 Other hyperlipidemia: Secondary | ICD-10-CM | POA: Diagnosis not present

## 2021-06-30 DIAGNOSIS — Z6822 Body mass index (BMI) 22.0-22.9, adult: Secondary | ICD-10-CM | POA: Diagnosis not present

## 2021-06-30 DIAGNOSIS — R040 Epistaxis: Secondary | ICD-10-CM | POA: Diagnosis not present

## 2021-06-30 DIAGNOSIS — J849 Interstitial pulmonary disease, unspecified: Secondary | ICD-10-CM | POA: Diagnosis not present

## 2021-07-03 DIAGNOSIS — J8410 Pulmonary fibrosis, unspecified: Secondary | ICD-10-CM

## 2021-07-03 HISTORY — DX: Pulmonary fibrosis, unspecified: J84.10

## 2021-07-07 ENCOUNTER — Ambulatory Visit: Payer: Medicare Other | Admitting: Internal Medicine

## 2021-08-08 ENCOUNTER — Other Ambulatory Visit: Payer: Self-pay

## 2021-08-08 ENCOUNTER — Encounter: Payer: Self-pay | Admitting: Internal Medicine

## 2021-08-08 ENCOUNTER — Telehealth: Payer: Self-pay | Admitting: Internal Medicine

## 2021-08-08 ENCOUNTER — Ambulatory Visit: Payer: Medicare Other | Admitting: Internal Medicine

## 2021-08-08 VITALS — BP 108/72 | HR 104 | Ht 73.0 in | Wt 154.4 lb

## 2021-08-08 DIAGNOSIS — R634 Abnormal weight loss: Secondary | ICD-10-CM

## 2021-08-08 DIAGNOSIS — J84112 Idiopathic pulmonary fibrosis: Secondary | ICD-10-CM | POA: Diagnosis not present

## 2021-08-08 DIAGNOSIS — R1319 Other dysphagia: Secondary | ICD-10-CM

## 2021-08-08 DIAGNOSIS — R5383 Other fatigue: Secondary | ICD-10-CM
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
## 2021-08-08 NOTE — Telephone Encounter (Signed)
Dylan Shea  Dylan Shea has dysphagia.  He can only take pills crushed.  Wondering if pirfenidone and Internet can be crushed without losing efficacy?  Please evaluate.  This is not urgent because for the moment he is going) to consider clinical trial with IV monoclonal antibody

## 2021-08-08 NOTE — Patient Instructions (Addendum)
IPF (idiopathic pulmonary fibrosis) (HCC) UIP (usual interstitial pneumonitis) (HCC) Esophageal dysphagia Weight loss, abnormal Other fatigue   - agree with diagnosis of IPF -This tends to be a progressive disease -I think the work of the stone quality and swallowing issues along with recent COVID probably has made the disease progress -Fatigue and weight loss could be related to the disease  Plan  - We took a shared decision making to hold off on established antifibrotic's of pirfenidone and nintedanib because of the significant potential to contribute to weight loss, fatigue and GI side effects  -We took a shared decision making to evaluate you for IV infusion research protocol with a monoclonal antibody against connective tissue growth factor (zephyrus 095 study) v placebo  -Informed consent form given by Alleen Borne our research coordinator  -If you meet prescreen criteria we will invite you to participate in the study  -Later on if you decide that you want to try the establish antifibrotic's we can engage in the discussion  -I will send a message to your GI doctor to make sure you are not having any active acid reflux in order to protect your lungs  -I will also send a message to our pharmacist to make sure pirfenidone or nintedanib if crushed do not lose its potency  -Also talk to primary care physician about other factors that may be contributing to weight loss and fatigue  Followup  - Video visit with Dr. Chase Caller in few to several weeks -to evaluate progress  -This visit can be canceled if you are already in the study      -

## 2021-08-08 NOTE — Telephone Encounter (Signed)
Dr Theodosia Blender  - Has pulmonary fibrosis.  I am worried that his aspiration/acid reflux is a significant risk factor for progression.  I am wondering if surgery to control that is an option.  He is also been losing weight.  I am wondering if would be kind enough to evaluate him and try to optimize his GI situation is much as possible  THanks    SIGNATURE    Dr. Brand Males, M.D., F.C.C.P,  Pulmonary and Critical Care Medicine Staff Physician, [place] Director - Interstitial Lung Disease  Program  [facility] at [place], [place], [address]  NPI Number:  NPI #[id number] [place] Number: [id number]  Pager: [phone], If no answer  -[place] or [place] [phone] Telephone (clinical office): [phone] Telephone (research): [phone]  4:26 PM 08/08/2021

## 2021-08-12 ENCOUNTER — Encounter (INDEPENDENT_AMBULATORY_CARE_PROVIDER_SITE_OTHER): Payer: Medicare Other | Admitting: Internal Medicine

## 2021-08-12 ENCOUNTER — Other Ambulatory Visit: Payer: Self-pay

## 2021-08-12 ENCOUNTER — Encounter: Payer: Medicare Other | Admitting: *Deleted

## 2021-08-12 VITALS — BP 120/70 | HR 97 | Temp 97.6°F | Resp 28 | Ht 73.0 in | Wt 153.8 lb

## 2021-08-12 DIAGNOSIS — J84112 Idiopathic pulmonary fibrosis: Secondary | ICD-10-CM

## 2021-08-12 DIAGNOSIS — Z006 Encounter for examination for normal comparison and control in clinical research program: Secondary | ICD-10-CM

## 2021-08-12 NOTE — Research (Signed)
Title: FGCL-3019-095 (FibroGen Study) is a Phase 3, randomized, double-blind, placebo-controlled multicenter international study to evaluate the efficacy and safety of 30 mg/kg IV infusions of pamrevlumab administered every 3 weeks for 52 weeks as compared to placebo in subjects with Idiopathic Pulmonary Fibrosis. Primary end point is: change in FVC from baseline at week 52. ZEPHYRUS II STUDY  Protocol #: FGCL-3019-095, Clinical Trials #: [id number] Sponsor: [email or web address]  ([place], [place], [place])  Protocol: Protocol Amendment 1.3 [id number] IB: version 19 dated 05NOV2021 Main ICF: version 06JUN2022 Optional Genetic Consent: version [id number] OLE ICF: version 14DEC21  Key Features of Pamrevlumab (FG-3019) the study drug: a recombinant fully human IgG kappa monoclonal antibody that binds to CTGF and is being developed for treatment of diseases in which tissue fibrosis has a major pathogenic role. In particular, pamrevlumab appears to disrupt a CTGF autocrine loop in mesenchymal cells like myofibroblasts that reduces their recruitment of leukocytes like macrophages, mast and dendritic cells via chemokine secretion. This disruption results in collapse of the cellular crosstalk that drives tissue remodeling.  Key Inclusion Criteria: Age 47 to 30 years Diagnosis of IPF within the past 7 years  Interstitial pulmonary fibrosis defined by HRCT scan at Screening, with evidence of =10% to <50% parenchymal fibrosis and <25% honeycombing, within the whole lung. Not currently receiving treatment for IPF with approved therapy. a. FVC value =45% and =95% of predicted at screening b. DLCO percent of predicted and corrected by Hb value =25% and =90% at screening The extent of fibrosis is greater than the extent of emphysema on HRCT. Male subjects with partners of childbearing potential and male subjects of childbearing potential (including those <1 year postmenopausal) must use double barrier contraception  methods during conduct of study, and for 3 months after last dose of study drug.   Key Exclusion Criteria: Previous exposure to pamrevlumab; Ongoing acute IPF exacerbation, or suspicion of such process, during Screening or Randomization; Interstitial lung disease other than IPF; Poorly controlled chronic heart failure; clinical diagnosis of cor pulmonale requiring specific treatment; or severe pulmonary arterial hypertension; Smoking within 3 months of Screening and/or unwilling to avoid smoking throughout the study; Use of any investigational drugs, for IPF or not, within the 30 days prior to screening initiation. Or use of approved IPF therapies within 1 week prior to screening; High likelihood of lung transplantation within 6 months after Day 1; Any history of malignancy likely to result in significant disability or mortality likely to require significant medical or surgical intervention within the next 2 years. This does not include minor surgical procedures for localized cancer; Daily use of PDE-5 inhibitor drugs [e.g. sildenafil, tadalafil, other]. (Note: Intermittent use of one type for erectile dysfunction or severe pulmonary hypertension is allowed).  Mechanism of Action: Pamrevlumab is a human recombinant IgG monoclonal antibody that binds to connective tissue growth factor (CTGF).  CTGF plays a role by mediating the process of fibrosis. By binding to CTGF, pamrevlumab blocks its biologic activity; thereby preventing cell proliferation, adhesion and migration of growth factors involved in fibrotic changes. It is also being studied in other conditions where fibrotic changes play a role; liver fibrosis, Duchenne muscular dystrophy and certain cancers.   Half-life: The effective terminal elimination half-life at the 30 mg/kg dose level was calculated to be  10.1 days after a single dose and 12.2 days after seven doses in patients with IPF (from Study FGCL-3019-049).   Interactions No  known drug interactions. All concomitant medications to be reviewed by PI.   Safety  Data: Investigators Brochure, Edition 19.0 7902 subjects have been exposed to pamrevlumab, 501 with IPF The most common TEAEs in all subjects with IPF: (269 IPF patients have been exposed = 12 weeks, 182 patients = 24 weeks and 78 patients = 52 weeks) Cough, fatigue, dyspnea, upper respiratory tract infection, bronchitis, nasopharyngitis No known effect on qtc prolongation, renal or hepatic issues A total of 29 patients discontinued therapy ( 9 due to disease progression, the other 20 due to  abdominal pain, gastrointestinal hemorrhage, pulmonary embolism, sepsis, renal  failure, and dyspnea.  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 The sponsor does not believe that changes to the conduct of this study are warranted at this time.  26Sep2022: Embryo fetal development study findings in male rabbits treated with pamrevlumab determined potential risks for pregnant male patients that could cause fetal abnormalities. Per sponsor, there are no recommended changes to safety monitoring, study procedures, or study conduct as a result of this finding. Per PI, no safety or data integrity has been compromised.    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 The sponsor does not believe that changes to the conduct of this study are warranted at this  time. xxxxxxxxxxxxxxxxxxxxxxxxxxxxxxxx   Clinical Trials in IPF: Phase 1 study Study FGCL-MC3019-002, n=21 Enrolled 21 subjects with IPF 3 deaths occurred due to disease progression. None of the deaths were considered related to study drug.  No dose-limiting toxicities All adverse events were considered mild to moderate 76% of subjects experienced at least 1 TEAE The most common TEAEs: Pyrexia (n=3, 14% of subjects) Cough (n=3, 14% of subjects) Dyspnea (n=3, 14% of subjects) Respiratory tract infection (n=2, 9% of subjects)   Phase 2 study Study FGCL-3019-049, n=90 Enrolled 90 subjects with IPF 14 deaths occurred, 13 were deemed to be related to IPF 20% of subjects experienced a TEAE that led to study drug discontinuation IPF and respiratory failure were the two most common reasons for discontinuation; occurring in 8% and 3% of patients, respectively The most common TEAEs: Cough (n=34, 38% of subjects) Dyspnea (n=24, 27% of subjects) Fatigue (n=24, 27% of subjects) Nasopharyngitis (n=20, 22% of subjects) Respiratory tract infection (n=19, 21% of subjects) Bronchitis (n=18, 20% of subjects)   Phase 2 study Study FGCL-3019-067, n=103 103 subjects enrolled with IPF 9 deaths occurred; 4 deemed related to IPF, 5 related to other respiratory causes 18% of subjects experienced a TEAE that led to study drug discontinuation The most common TEAEs: Cough (n=48, 47% of subjects) Respiratory tract infection (n=39, 38% of subjects) IPF (n=32, 31% of subjects) Dysnpea (n=30, 29% of subjects) Sinusitis (n=21, 21% of subjects) Fatigue (n=20, 19% of subjects)  Overall, pamrevlumab has been well tolerated. Infusion-related reactions have been reported at a rate that is consistent with other human monoclonal antibodies. Based on the mechanism of action of pamrevlumab, by inhibiting CTGF, there was some concern that this would cause impaired wound healing or impaired bone fracture healing.  However, there were no serious adverse  events reported in any study relating to these two issues. Results from studies in subjects with IPF suggested that pamrevlumab slowed the progression of IPF as measured by change in ppFVC, quantitative analysis of fibrosis using HRCT, and time to disease progression or death.    [facility] Coordinator note :   This visit for Subject Dylan Shea with DOB: August 30, 1950 on 08/12/2021 for the above protocol is Visit/Encounter # Screening and is for purpose of research.   The consent for this encounter is under Protocol: Protocol Amendment 1.3 [id number] IB: version 19 dated 05NOV2021 Main ICF: version 06JUN2022 Optional Genetic Consent: version [id number] OLE ICF: version 14DEC21  Subject expressed continued interest and consent in continuing as a study subject. Subject confirmed that there was no change in contact information (e.g. address, telephone, email). Subject thanked for participation in research and contribution to science.  In this visit 08/12/2021 the subject will be evaluated by Principal Investigator named Dr. Chase Caller. This research coordinator has verified that the above investigator is up to date with his training logs.   The Subject was informed that the PI Dr. Chase Caller continues to have oversight of the subject's visits and course through relevant discussions, reviews and also specifically of this visit by routing of this note to the [facility].  This visit is a key visit of Screening. The PI is available for this visit. In addition, ahead of the key visit of Screening (screening, randomization, end of study) the visit and subject were  discussed with the PI Dr. Chase Caller on date of 10-Aug-2021 face to face as part of direct PI oversight.  The subject arrived and was taken to the examination room for the informed consent process. PI Dr. Chase Caller participated in the consent process. The subject verbalized understanding of  the information, had an opportunity to ask questions, and agreed to participate piror to any study-related procedures. Please refer to paper source subject binder for further details regarding consent process.   Signed by  Lytle Michaels Clinical Research Coordinator [facility]  [place], [place] 1:24 PM 08/12/2021

## 2021-08-13 NOTE — Progress Notes (Signed)
Title: FGCL-3019-095 (FibroGen Study) is a Phase 3, randomized, double-blind, placebo-controlled multicenter international study to evaluate the efficacy and safety of 30 mg/kg IV infusions of pamrevlumab administered every 3 weeks for 52 weeks as compared to placebo in subjects with Idiopathic Pulmonary Fibrosis. Primary end point is: change in FVC from baseline at week 52. ZEPHYRUS II STUDY  Protocol #: [id number]-3019-095, Clinical Trials #: [id number] Sponsor: [email or web address]  ([place], [place], [place])   Nature conservation officer Features of Pamrevlumab (FG-3019) the study drug: a recombinant fully human IgG kappa monoclonal antibody that binds to CTGF and is being developed for treatment of diseases in which tissue fibrosis has a major pathogenic role. In particular, pamrevlumab appears to disrupt a CTGF autocrine loop in mesenchymal cells like myofibroblasts that reduces their recruitment of leukocytes like macrophages, mast and dendritic cells via chemokine secretion. This disruption results in collapse of the cellular crosstalk that drives tissue remodeling.  Key Inclusion Criteria: Age 85 to 59 years Diagnosis of IPF within the past 7 years  Interstitial pulmonary fibrosis defined by HRCT scan at Screening, with evidence of =10% to <50% parenchymal fibrosis and <25% honeycombing, within the whole lung. Not currently receiving treatment for IPF with approved therapy. a. FVC value =45% and =95% of predicted at screening b. DLCO percent of predicted and corrected by Hb value =25% and =90% at screening The extent of fibrosis is greater than the extent of emphysema on HRCT. Male subjects with partners of childbearing potential and male subjects of childbearing potential (including those <1 year postmenopausal) must use double barrier contraception methods during conduct of study, and for 3 months after last dose of study drug.   Key Exclusion Criteria: Previous exposure to pamrevlumab; Ongoing acute IPF  exacerbation, or suspicion of such process, during Screening or Randomization; Interstitial lung disease other than IPF; Poorly controlled chronic heart failure; clinical diagnosis of cor pulmonale requiring specific treatment; or severe pulmonary arterial hypertension; Smoking within 3 months of Screening and/or unwilling to avoid smoking throughout the study; Use of any investigational drugs, for IPF or not, within the 30 days prior to screening initiation. Or use of approved IPF therapies within 1 week prior to screening; High likelihood of lung transplantation within 6 months after Day 1; Any history of malignancy likely to result in significant disability or mortality likely to require significant medical or surgical intervention within the next 2 years. This does not include minor surgical procedures for localized cancer; Daily use of PDE-5 inhibitor drugs [e.g. sildenafil, tadalafil, other]. (Note: Intermittent use of one type for erectile dysfunction or severe pulmonary hypertension is allowed).  Mechanism of Action: Pamrevlumab is a human recombinant IgG monoclonal antibody that binds to connective tissue growth factor (CTGF).  CTGF plays a role by mediating the process of fibrosis. By binding to CTGF, pamrevlumab blocks its biologic activity; thereby preventing cell proliferation, adhesion and migration of growth factors involved in fibrotic changes. It is also being studied in other conditions where fibrotic changes play a role; liver fibrosis, Duchenne muscular dystrophy and certain cancers.   Half-life: The effective terminal elimination half-life at the 30 mg/kg dose level was calculated to be  10.1 days after a single dose and 12.2 days after seven doses in patients with IPF (from Study FGCL-3019-049).   Interactions No known drug interactions. All concomitant medications to be reviewed by PI.   Safety Data: Investigators Brochure, Edition 19.0 1199 subjects have been exposed to  pamrevlumab, 501 with IPF The most common TEAEs in all subjects with  IPF: (269 IPF patients have been exposed = 12 weeks, 182 patients = 24 weeks and 78 patients = 52 weeks) Cough, fatigue, dyspnea, upper respiratory tract infection, bronchitis, nasopharyngitis No known effect on qtc prolongation, renal or hepatic issues A total of 29 patients discontinued therapy ( 9 due to disease progression, the other 20 due to  abdominal pain, gastrointestinal hemorrhage, pulmonary embolism, sepsis, renal  failure, and dyspnea.  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 The sponsor does not believe that changes to the conduct of this study are warranted at this time.  26Sep2022: Embryo fetal development study findings in male rabbits treated with pamrevlumab determined potential risks for pregnant male patients that could cause fetal abnormalities. Per sponsor, there are no recommended changes to safety monitoring, study procedures, or study conduct as a result of this finding. Per PI, no safety or data integrity has been compromised.    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 The sponsor does not believe that changes to the conduct of this study are warranted at this time. xxxxxxxxxxxxxxxxxxxxxxxxxxxxxxxx   Clinical Trials in IPF: Phase 1 study Study FGCL-MC3019-002, n=21 Enrolled 21 subjects with IPF 3 deaths occurred due to disease progression.  None of the deaths were considered related to study drug.  No dose-limiting toxicities All adverse events were considered mild to moderate 76% of subjects experienced at least 1 TEAE The most common TEAEs: Pyrexia (n=3, 14% of subjects) Cough (n=3, 14% of subjects) Dyspnea (n=3, 14% of subjects) Respiratory tract infection (n=2, 9% of subjects)   Phase 2 study Study FGCL-3019-049, n=90 Enrolled 90 subjects with IPF 14 deaths occurred, 13 were deemed to be related to IPF 20% of subjects experienced a TEAE that led to study drug discontinuation IPF and respiratory failure were the two most common reasons for discontinuation; occurring in 8% and 3% of patients, respectively The most common TEAEs: Cough (n=34, 38% of subjects) Dyspnea (n=24, 27% of subjects) Fatigue (n=24, 27% of subjects) Nasopharyngitis (n=20, 22% of subjects) Respiratory tract infection (n=19, 21% of subjects) Bronchitis (n=18, 20% of subjects)   Phase 2 study Study FGCL-3019-067, n=103 103 subjects enrolled with IPF 9 deaths occurred; 4 deemed related to IPF, 5 related to other respiratory causes 18% of subjects experienced a TEAE that led to study drug discontinuation The most common TEAEs: Cough (n=48, 47% of subjects) Respiratory tract infection (n=39, 38% of subjects) IPF (n=32, 31% of subjects) Dysnpea (n=30, 29% of subjects) Sinusitis (n=21, 21% of subjects) Fatigue (n=20, 19% of subjects)  Overall, pamrevlumab has been well tolerated. Infusion-related reactions have been reported at a rate that is consistent with other human monoclonal antibodies. Based on the mechanism of action of pamrevlumab, by inhibiting CTGF, there was some concern that this would cause impaired wound healing or impaired bone fracture healing. However, there were no serious adverse events reported in any study relating to these two issues. Results from studies in subjects with IPF suggested that pamrevlumab slowed the progression of  IPF as measured by change in ppFVC, quantitative analysis of fibrosis using HRCT, and time to disease progression or death.  Xxxxxxxxxxxxxxxxxxxxxxxxxxxxxxxxxxxxxxxxxxx  S: This visit for Subject Dylan Shea with DOB: [birth year] on 08/13/2021 for the above protocol is Visit/Encounter # consent  and is for purpose of research . Subject/LAR expressed continued interest and consent in continuing as a study subject. Subject thanked for participation in research and contribution to science.   Consent done first  Objective Exam done post concent  A Research IPF  P Per protocol     SIGNATURE    Dr. Brand Males, M.D., F.C.C.P,  Pulmonary and Critical Care Medicine Staff Physician, [place] Director - Interstitial Lung Disease  Program  [facility] [place], [place], [address]  NPI Number:  NPI #[id number] [place] Number: [id number]  Pager: [phone], If no answer  -> Check AMION or Try [place] Telephone (clinical office): [phone] Telephone (research): [phone]  2:09 AM 08/13/2021

## 2021-08-18 ENCOUNTER — Other Ambulatory Visit: Payer: Self-pay

## 2021-08-19 ENCOUNTER — Other Ambulatory Visit: Payer: Self-pay | Admitting: Internal Medicine

## 2021-08-19 ENCOUNTER — Encounter: Payer: Medicare Other | Admitting: Adult Health

## 2021-08-19 DIAGNOSIS — J84112 Idiopathic pulmonary fibrosis: Secondary | ICD-10-CM

## 2021-08-19 DIAGNOSIS — Z006 Encounter for examination for normal comparison and control in clinical research program: Secondary | ICD-10-CM

## 2021-08-26 ENCOUNTER — Ambulatory Visit (INDEPENDENT_AMBULATORY_CARE_PROVIDER_SITE_OTHER)
Admission: RE | Admit: 2021-08-26 | Discharge: 2021-08-26 | Disposition: A | Discharge status: Home / Self Care | Payer: Self-pay | Source: Ambulatory Visit | Attending: Internal Medicine | Admitting: Internal Medicine

## 2021-08-26 ENCOUNTER — Other Ambulatory Visit: Payer: Self-pay

## 2021-08-26 DIAGNOSIS — Z006 Encounter for examination for normal comparison and control in clinical research program: Secondary | ICD-10-CM

## 2021-08-26 DIAGNOSIS — J479 Bronchiectasis, uncomplicated: Secondary | ICD-10-CM | POA: Diagnosis not present

## 2021-08-26 DIAGNOSIS — I700 Atherosclerosis of aorta: Secondary | ICD-10-CM | POA: Diagnosis not present

## 2021-08-26 DIAGNOSIS — I2510 Atherosclerotic heart disease of native coronary artery without angina pectoris: Secondary | ICD-10-CM | POA: Diagnosis not present

## 2021-08-26 DIAGNOSIS — J84112 Idiopathic pulmonary fibrosis: Secondary | ICD-10-CM | POA: Diagnosis not present

## 2021-08-30 ENCOUNTER — Encounter (INDEPENDENT_AMBULATORY_CARE_PROVIDER_SITE_OTHER): Payer: Self-pay | Admitting: Internal Medicine

## 2021-08-30 ENCOUNTER — Telehealth (INDEPENDENT_AMBULATORY_CARE_PROVIDER_SITE_OTHER): Payer: Medicare Other | Admitting: Internal Medicine

## 2021-08-30 ENCOUNTER — Other Ambulatory Visit: Payer: Self-pay

## 2021-08-30 ENCOUNTER — Ambulatory Visit (INDEPENDENT_AMBULATORY_CARE_PROVIDER_SITE_OTHER): Payer: Medicare Other | Admitting: Internal Medicine

## 2021-08-30 VITALS — BP 119/74 | HR 80 | Temp 97.6°F | Ht 73.0 in | Wt 159.4 lb

## 2021-08-30 DIAGNOSIS — J84112 Idiopathic pulmonary fibrosis: Secondary | ICD-10-CM

## 2021-08-30 DIAGNOSIS — R634 Abnormal weight loss: Secondary | ICD-10-CM

## 2021-08-30 DIAGNOSIS — R1319 Other dysphagia: Secondary | ICD-10-CM

## 2021-08-30 DIAGNOSIS — Z86010 Personal history of colonic polyps: Secondary | ICD-10-CM | POA: Diagnosis not present
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
## 2021-08-30 NOTE — Patient Instructions (Signed)
Physician will call with results of barium study and further recommendations.

## 2021-08-30 NOTE — Patient Instructions (Addendum)
IPF (idiopathic pulmonary fibrosis) (HCC) UIP (usual interstitial pneumonitis) (HCC) Esophageal dysphagia Weight loss, abnormal Other fatigue   IPF -  Going through research screening Awaiting esophogram Holding off on pirfenidone or nintedanib based on shared decision making  Plan  - await more screening data before deciding on randomization to Zephyrus 095 protoocol

## 2021-08-30 NOTE — Progress Notes (Signed)
xxxxxxxxxxxxxxxxxxxxxxxxxxxxxxxxxxxxxxxxxxxxxxxxxxxxxxxxxxxxxxxxxx OV 08/08/2021 -referred to Dr. Chase Caller by Dr. Halford Chessman  for pulmonary fibrosis comanagement  Subjective:  Patient ID: Dylan Shea, male , DOB: 1950-10-08 , age 71 y.o. , MRN: [id number] , ADDRESS: [place] [address] PCP Neale Burly, MD Patient Care Team: Neale Burly, MD as PCP - General (Internal Medicine)  This Provider for this visit: Treatment Team:  Attending Provider: Brand Males, MD    08/08/2021 -   Chief Complaint  Patient presents with   Follow-up    Patient is here switching from Dr. Halford Chessman to MR for ILD.  Pt states that he does have complaints of SOB when he exerts himself and also has an occasional cough.     HPI Dylan Shea 71 y.o. -patient presents with his wife Dylan Shea and son Dylan Shea.  He lives in the [place] area.  He has pulmonary fibrosis diagnosed by Dr. Halford Chessman and therefore he has been referred here.  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  He retired in 1996.  Also in the background for several years he has been dealing with cervical spurs and has chronic hoarseness of voice associated with dysphagia for the last 2 years.  He has been aspirating food.  Review of the records indicate that this was even there in 2019.  He sees GI in [place].  This was progressively getting worse.  He cannot take pills.  He has to crush it.  Then in March 2022 on 1 particular day he had 2 falls.  The unsure why.  There was another fall this time with a trip and fracture to the collarbone in May 2022.  They suspect this might have been because of a TIA that was diagnosed in June 2022.  CT scan of the chest at that time showed some pneumomediastinum but also showed pulmonary fibrosis.  This the first time he had a CT scan of the chest.  This the first time he became aware he has pulmonary fibrosis.  He subsequently established  with Dr. Halford Chessman.  Around this time he started having insidious onset of fatigue also worsening shortness of breath and progressive weight loss.  All this has progressed.  In the last 2 or 3 months he has lost close to 20 pounds of weight.  He was weighing 171 pounds 2-3 months ago and currently weighs 154 pounds.  In December 2022 he had COVID and since then has been an acceleration in fatigue and shortness of breath the COVID was an outpatient COVID.  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  He has had autoimmune serology and this is negative for any autoimmune disease  ILD question is as below.   [facility] Comprehensive ILD Questionnaire  Symptoms:  Shortness of breath insidious.  Particular in the last 10 months.  Progressively getting worse. SYMPTOM SCALE - ILD 08/08/2021  Current weight   O2 use ra  Shortness of Breath 0 -> 5 scale with 5 being worst (score 6 If unable to do)  At rest 0  Simple tasks - showers, clothes change, eating, shaving 5  Household (dishes, doing bed, laundry) 6  Shopping 6  Walking level at own pace 5  Walking up Stairs 6  Total (30-36) Dyspnea Score 28      Non-dyspnea symptoms (0-> 5 scale) 08/08/2021  How bad is your cough? 5  How bad is  your fatigue 5  How bad is nausea 0  How bad is vomiting?  0  How bad is diarrhea? 0  How bad is anxiety? 0  How bad is depression 0  Any chronic pain - if so where and how bad Hands and back     Past Medical History :  -COVID in December 2022 treated as outpatient Severe dysphagia-with aspiration ongoing sees GI. -No connective tissue disease -Falls and TIA in May/June 2022 -Has cervical spurs and chronic hoarseness of voice and also dysphagia with resultant aspiration   ROS:  -Hoarseness of voice because of cervical spurs -Arthralgia -Severe fatigue -Unintentional weight loss 20-40 pounds in 6 months -Acid reflux disease -Failure to thrive  FAMILY HISTORY of LUNG DISEASE:   -Only he has pulmonary fibrosis nobody in the else in the family has pulmonary fibrosis or any other lung disease  PERSONAL EXPOSURE HISTORY:  -Did not smoke.  He did do marijuana very little in 1974.  No cocaine use no intravenous drug use.  HOME  EXPOSURE and HOBBY DETAILS :  [place].  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  Otherwise exposure history is negative  PULMONARY TOXICITY HISTORY (27 items):  -Denies  INVESTIGATIONS: Below  -PFTs with mild to moderate restriction -CT scan with classic UIP personally visualized and shown to the family -Serology negative -Has prior aspiration 2019     CT Chest data  No results found.    PFT  PFT Results Latest Ref Rng & Units 03/25/2021  DLCO uncorrected ml/min/mmHg 21.78  DLCO UNC% % 76  DLCO corrected ml/min/mmHg 22.09  DLCO COR %Predicted % 77  DLVA Predicted % 134  TLC L 5.95  TLC % Predicted % 77  RV % Predicted % 107     Latest Reference Range & Units 02/25/21 10:47  Anti Nuclear Antibody (ANA) Negative  Negative  Atypical pANCA Neg:<1:20 titer <1:20  RA Latex Turbid. <14.0 IU/mL <10.0  Cytoplasmic (C-ANCA) Neg:<1:20 titer <1:20  P-ANCA Neg:<1:20 titer <1:20  Anti-MPO Antibodies 0.0 - 0.9 units <0.2  Anti-PR3 Antibodies 0.0 - 0.9 units <0.2  Scleroderma (Scl-70) (ENA) Antibody, IgG 0.0 - 0.9 AI <0.2    CT CHEST HIGH RES 06/21/21  IMPRESSION: 1. Moderate pulmonary fibrosis in a pattern with slight apical to basal gradient, featuring irregular peripheral interstitial opacity, septal thickening, and areas of traction bronchiectasis and subpleural bronchiolectasis with probable honeycombing at the  lung bases. Fibrotic findings are not significantly changed compared to relatively recent prior examination dated 12/04/2020. Findings are consistent with UIP per consensus guidelines: Diagnosis of Idiopathic Pulmonary Fibrosis: An Official ATS/ERS/JRS/ALAT Clinical Practice Guideline. [place], Iss 5, [phone], Mar 03 2017. 2. Numerous prominent mediastinal lymph nodes, likely reactive to fibrosis. 3. Coronary artery disease.   Aortic Atherosclerosis (ICD10-I70.0).     Electronically Signed   By: Delanna Ahmadi M.D.   On: 06/22/2021 09:44   2019 SWALLOW STUDY 04/15/18       HPI  Swade Shonka is a 71 yo male who was referred by Deberah Castle and Dr. Laural Golden for MBSS due to suspected oropharyngeal dysphagia. Pt had EGD  in Oct 2018 with empirical dilation, which relieved Pt's symptoms short term. His wife states that Pt is choking on everything. He had a Ba Swallow on 04/15/2018 which showed: Laryngeal penetration and significant aspiration of contrast into the trachea, RIGHT mainstem bronchus and into RIGHT lower lobe. Markedly delayed and diminished spontaneous cough reflex with poor clearance of aspirated barium. No esophageal stricture identified, with 12.5 mm diameter barium tablet able to passed oral cavity without obstruction. He was accompanied to today's MBSS by his wife.    IMPRESSION: Laryngeal penetration and significant aspiration of contrast into the trachea, RIGHT mainstem bronchus and into RIGHT lower lobe.   Markedly delayed and diminished spontaneous cough reflex with poor clearance of aspirated barium.   No esophageal stricture identified, with 12.5 mm diameter barium tablet able to passed oral cavity without obstruction.   Findings called to Deberah Castle NP on 04/15/2018 at 1049 hours.     Electronically Signed   By: Lavonia Dana M.D.   On: 04/15/2018 10:51   OV 08/30/2021  Subjective:  Patient ID: Dylan Shea, male , DOB:  10-Sep-1950 , age 79 y.o. , MRN: [id number] , ADDRESS: [place] [address] PCP Neale Burly, MD Patient Care Team: Neale Burly, MD as PCP - General (Internal Medicine)  This Provider for this visit: Treatment Team:  Attending Provider: Brand Males, MD    08/30/2021 -  IPF followup   Type of visit: Telephone Circumstance: COVID-19 national emergency Identification of patient Dylan Shea with 03-28-1951 and MRN [id number] - 2 person identifier Risks: Risks, benefits, limitations of telephone visit explained. Patient understood and verbalized agreement to proceed Anyone else on call: jsut hiome Patient location: his home This provider location: [address]; [place]; [place] [address]. Dylan Shea. [place] 71 y.o. -initially set up as a video visit but had to be converted to a telephone visit because he did not show up for the video visit.  I then called him.  He said his wife is not at home.  His family is not at home.  He does not know how to do a video visit.  So we did this as a telephone visit.  He tells me that since last visit he has gained 5 pounds of weight because he is eating well.  On 08/12/2021 he did do informed consent for this Zepyrus 095 research study involving IV monoclonal antibody against connective tissue growth factor versus placebo.  He has now had high-resolution CT chest.  He did do a spirometry and DLCO and he did screen past from that perspective.  The high-resolution CT chest done below was under the research protocol.  In my personal visualization this is classic UIP.  I agree with the radiologist who is that this is classic UIP.  It is now being submitted to the [facility] folks for blinded independent radiology read.  He and his family at this point have opted against standard of care antifibrotic's particularly given his weight loss and fatigue and GI issues.  They will reconsider this at  a future time.  I discussed via secure chat with Dr. Laural Golden his GI doctor who he saw today.  Esophagram is planned today.  A colonoscopy needs to be done at some point in the future due to tubular adenoma removal in 2012  CT Chest data - HRCT   CT Chest High Resolution  Result Date: 08/29/2021 CLINICAL DATA:  History  of IPF EXAM: CT CHEST WITHOUT CONTRAST TECHNIQUE: Multidetector CT imaging of the chest was performed following the standard protocol without intravenous contrast. High resolution imaging of the lungs, as well as inspiratory and expiratory imaging, was performed. RADIATION DOSE REDUCTION: This exam was performed according to the departmental dose-optimization program which includes automated exposure control, adjustment of the mA and/or kV according to patient size and/or use of iterative reconstruction technique. COMPARISON:  06/21/2021, 12/04/2020 FINDINGS: Cardiovascular: Aortic atherosclerosis. Normal heart size. Three-vessel coronary artery calcifications. No pericardial effusion. Mediastinum/Nodes: Unchanged prominent mediastinal and hilar lymph nodes. Thyroid gland, trachea, and esophagus demonstrate no significant findings. Lungs/Pleura: Unchanged moderate pulmonary fibrosis in a pattern with slight apical to basal gradient, featuring irregular peripheral interstitial opacity, septal thickening, areas of traction bronchiectasis and subpleural bronchiolectasis, and areas of honeycombing at the lung bases. No significant air trapping on expiratory phase imaging. No pleural effusion or pneumothorax. Upper Abdomen: No acute abnormality. Musculoskeletal: No chest wall abnormality. No suspicious osseous lesions identified. IMPRESSION: 1. Unchanged moderate pulmonary fibrosis in a pattern with slight apical to basal gradient, featuring irregular peripheral interstitial opacity, septal thickening, areas of traction bronchiectasis and subpleural bronchiolectasis, and areas of honeycombing at the  lung bases. Findings remain consistent with UIP per consensus guidelines: Diagnosis of Idiopathic Pulmonary Fibrosis: An Official ATS/ERS/JRS/ALAT Clinical Practice Guideline. [place], Iss 5, [phone], Mar 03 2017. 2. Unchanged prominent mediastinal and hilar lymph nodes, likely reactive to fibrosis. 3. Coronary artery disease. Aortic Atherosclerosis (ICD10-I70.0). Electronically Signed   By: Delanna Ahmadi M.D.   On: 08/29/2021 10:32      PFT  PFT Results Latest Ref Rng & Units 03/25/2021  DLCO uncorrected ml/min/mmHg 21.78  DLCO UNC% % 76  DLCO corrected ml/min/mmHg 22.09  DLCO COR %Predicted % 77  DLVA Predicted % 134  TLC L 5.95  TLC % Predicted % 77  RV % Predicted % 107       has a past medical history of Diabetes ([place]) (01/10/2017), GERD (gastroesophageal reflux disease), Hypercholesteremia, and Hypertension.   reports that he has never smoked. He has never used smokeless tobacco.  Past Surgical History:  Procedure Laterality Date   COLONOSCOPY     ESOPHAGEAL DILATION N/A 04/26/2017   Procedure: ESOPHAGEAL DILATION;  Surgeon: Rogene Houston, MD;  [facility];  Service: Endoscopy;  Laterality: N/A;   ESOPHAGOGASTRODUODENOSCOPY N/A 04/26/2017   Procedure: ESOPHAGOGASTRODUODENOSCOPY (EGD);  Surgeon: Rogene [place], MD;  Location: [facility];  Service: Endoscopy;  Laterality: N/A;  12:45    No Known Allergies  Immunization History  Administered Date(s) Administered   Fluad Quad(high Dose 65+) 06/02/2021   Moderna Sars-Covid-2 Vaccination 10/14/2019, 11/11/2019   Pneumococcal Polysaccharide-23 04/28/2017   Tdap 11/09/2020    Family History  Problem Relation Age of Onset   Emphysema Mother      Current Outpatient Medications:    albuterol (VENTOLIN HFA) 108 (90 Base) MCG/ACT inhaler, Inhale 1-2 puffs into the lungs every 6 (six) hours as needed., Disp: 8 g, Rfl: 2   atorvastatin (LIPITOR) 10 MG tablet, Take 10 mg by mouth  daily., Disp: , Rfl:    clopidogrel (PLAVIX) 75 MG tablet, Take 75 mg by mouth daily., Disp: , Rfl:    insulin glargine (LANTUS) 100 UNIT/ML injection, Inject into the skin daily. Pt thinks he is taking 30 units. Wife gives him his shots, Disp: , Rfl:    Levothyroxine Sodium 50 MCG CAPS, Take by mouth daily  before breakfast., Disp: , Rfl:       Objective:   There were no vitals filed for this visit.  Estimated body mass index is 21.03 kg/m as calculated from the following:   Height as of an earlier encounter on 08/30/21: 6\' 1"  (1.854 m).   Weight as of an earlier encounter on 08/30/21: 159 lb 6.4 oz (72.3 kg).  @WEIGHTCHANGE @  There were no vitals filed for this visit.   Physical Exam He had a hoarse voice but otherwise sounded normal on the phone.      Assessment:       ICD-10-CM   1. IPF (idiopathic pulmonary fibrosis) ([place])  J84.112          Plan:     Patient Instructions  IPF (idiopathic pulmonary fibrosis) (HCC) UIP (usual interstitial pneumonitis) (HCC) Esophageal dysphagia Weight loss, abnormal Other fatigue   IPF -  Going through research screening Awaiting esophogram Holding off on pirfenidone or nintedanib based on shared decision making  Plan  - await more screening data before deciding on randomization to [place] protoocol   (Telephone visit - Level 02 visit: Estb 11-20 for this visit type which was visit type: telephone visit in total care time and counseling or/and coordination of care by this undersigned MD - Dr Brand Males. This includes one or more of the following for care delivered on 08/30/2021 same day: pre-charting, chart review, note writing, documentation discussion of test results, diagnostic or treatment recommendations, prognosis, risks and benefits of management options, instructions, education, compliance or risk-factor reduction. It excludes time spent by the [facility] or [facility] staff in the care of the patient. Actual time was 19  min. E&M code is [phone])   SIGNATURE    Dr. Brand Males, M.D., F.C.C.P,  Pulmonary and Critical Care Medicine Staff Physician, [facility] Director - Interstitial Lung Disease  Program  [facility] [place], [place], [address]  Pager: [phone], If no answer or between  15:00h - 7:00h: call [phone] Telephone: [phone]  11:51 AM 08/30/2021

## 2021-09-01 DIAGNOSIS — E1169 Type 2 diabetes mellitus with other specified complication: Secondary | ICD-10-CM | POA: Diagnosis not present

## 2021-09-01 DIAGNOSIS — Z0000 Encounter for general adult medical examination without abnormal findings: Secondary | ICD-10-CM | POA: Diagnosis not present

## 2021-09-01 DIAGNOSIS — E7849 Other hyperlipidemia: Secondary | ICD-10-CM | POA: Diagnosis not present

## 2021-09-01 DIAGNOSIS — Z6821 Body mass index (BMI) 21.0-21.9, adult: Secondary | ICD-10-CM | POA: Diagnosis not present

## 2021-09-01 DIAGNOSIS — M545 Low back pain, unspecified: Secondary | ICD-10-CM | POA: Diagnosis not present

## 2021-09-01 DIAGNOSIS — J849 Interstitial pulmonary disease, unspecified: Secondary | ICD-10-CM | POA: Diagnosis not present

## 2021-09-01 DIAGNOSIS — E038 Other specified hypothyroidism: Secondary | ICD-10-CM | POA: Diagnosis not present

## 2021-09-08 ENCOUNTER — Other Ambulatory Visit: Payer: Self-pay

## 2021-09-08 ENCOUNTER — Encounter (INDEPENDENT_AMBULATORY_CARE_PROVIDER_SITE_OTHER): Payer: Medicare Other | Admitting: Adult Health

## 2021-09-08 ENCOUNTER — Encounter: Payer: Self-pay | Admitting: Adult Health

## 2021-09-08 ENCOUNTER — Ambulatory Visit (INDEPENDENT_AMBULATORY_CARE_PROVIDER_SITE_OTHER): Payer: Medicare Other

## 2021-09-08 ENCOUNTER — Encounter: Payer: Medicare Other | Admitting: *Deleted

## 2021-09-08 DIAGNOSIS — Z006 Encounter for examination for normal comparison and control in clinical research program: Secondary | ICD-10-CM

## 2021-09-08 DIAGNOSIS — J849 Interstitial pulmonary disease, unspecified: Secondary | ICD-10-CM

## 2021-09-08 DIAGNOSIS — J84112 Idiopathic pulmonary fibrosis: Secondary | ICD-10-CM

## 2021-09-08 MED ORDER — STUDY - ZEPHYRUS II - PAMREVLUMAB OR PLACEBO 10 MG/ML IV INFUSION (PI-RAMASWAMY)
30.0000 mg/kg | Freq: Once | INTRAVENOUS | Status: AC
  Administered 2021-09-08: 11:00:00 2150 mg via INTRAVENOUS
  Filled 2021-09-08: qty 215

## 2021-09-08 NOTE — Patient Instructions (Signed)
Continue with research protocol.  

## 2021-09-08 NOTE — Research (Signed)
Title: FGCL-3019-095 (FibroGen Study) is a Phase 3, randomized, double-blind, placebo-controlled multicenter international study to evaluate the efficacy and safety of 30 mg/kg IV infusions of pamrevlumab administered every 3 weeks for 52 weeks as compared to placebo in subjects with Idiopathic Pulmonary Fibrosis. Primary end point is: change in FVC from baseline at week 52. ZEPHYRUS II STUDY  Protocol #: FGCL-3019-095, Clinical Trials #: [id number] Sponsor: [email or web address]  ([place], [place], [place])  Nature conservation officer Features of Pamrevlumab (FG-3019) the study drug: a recombinant fully human IgG kappa monoclonal antibody that binds to CTGF and is being developed for treatment of diseases in which tissue fibrosis has a major pathogenic role. In particular, pamrevlumab appears to disrupt a CTGF autocrine loop in mesenchymal cells like myofibroblasts that reduces their recruitment of leukocytes like macrophages, mast and dendritic cells via chemokine secretion. This disruption results in collapse of the cellular crosstalk that drives tissue remodeling.  Key Inclusion Criteria: Age 10 to 9 years Diagnosis of IPF within the past 7 years  Interstitial pulmonary fibrosis defined by HRCT scan at Screening, with evidence of =10% to <50% parenchymal fibrosis and <25% honeycombing, within the whole lung. Not currently receiving treatment for IPF with approved therapy. a. FVC value =45% and =95% of predicted at screening b. DLCO percent of predicted and corrected by Hb value =25% and =90% at screening The extent of fibrosis is greater than the extent of emphysema on HRCT. Male subjects with partners of childbearing potential and male subjects of childbearing potential (including those <1 year postmenopausal) must use double barrier contraception methods during conduct of study, and for 3 months after last dose of study drug.   Key Exclusion Criteria: Previous exposure to pamrevlumab; Ongoing acute IPF exacerbation,  or suspicion of such process, during Screening or Randomization; Interstitial lung disease other than IPF; Poorly controlled chronic heart failure; clinical diagnosis of cor pulmonale requiring specific treatment; or severe pulmonary arterial hypertension; Smoking within 3 months of Screening and/or unwilling to avoid smoking throughout the study; Use of any investigational drugs, for IPF or not, within the 30 days prior to screening initiation. Or use of approved IPF therapies within 1 week prior to screening; High likelihood of lung transplantation within 6 months after Day 1; Any history of malignancy likely to result in significant disability or mortality likely to require significant medical or surgical intervention within the next 2 years. This does not include minor surgical procedures for localized cancer; Daily use of PDE-5 inhibitor drugs [e.g. sildenafil, tadalafil, other]. (Note: Intermittent use of one type for erectile dysfunction or severe pulmonary hypertension is allowed).  Mechanism of Action: Pamrevlumab is a human recombinant IgG monoclonal antibody that binds to connective tissue growth factor (CTGF).  CTGF plays a role by mediating the process of fibrosis. By binding to CTGF, pamrevlumab blocks its biologic activity; thereby preventing cell proliferation, adhesion and migration of growth factors involved in fibrotic changes. It is also being studied in other conditions where fibrotic changes play a role; liver fibrosis, Duchenne muscular dystrophy and certain cancers.   Half-life: The effective terminal elimination half-life at the 30 mg/kg dose level was calculated to be  10.1 days after a single dose and 12.2 days after seven doses in patients with IPF (from Study FGCL-3019-049).   Interactions No known drug interactions. All concomitant medications to be reviewed by PI.   Safety Data: Investigators Brochure, Edition 19.0 1199 subjects have been exposed to pamrevlumab,  501 with IPF The most common TEAEs in all subjects with IPF: (  269 IPF patients have been exposed = 12 weeks, 182 patients = 24 weeks and 78 patients = 52 weeks) Cough, fatigue, dyspnea, upper respiratory tract infection, bronchitis, nasopharyngitis No known effect on qtc prolongation, renal or hepatic issues A total of 29 patients discontinued therapy ( 9 due to disease progression, the other 20 due to  abdominal pain, gastrointestinal hemorrhage, pulmonary embolism, sepsis, renal  failure, and dyspnea.  Xxxxxxxxxxxxxxxxxxxxx [facility] meeting Aug 2021 - cleared to continue Xxxxxxxxxxxxxx Updates: - Sep2021: 1 anaphylactic reaction on 10th infusion on Duchenne patient in [place] - causally related - 2.04% incidence of PE (between IPF 6 patients, and 8 with pancreatic cancer of 685 in studye with 562 thought to be on IP) - possible cause by IP, but sponsor believes no changes to study program Arletha [place] [id number] for protocol to continue without changes   06-May-2021: As of this date 2 cases of Serious Adverse Event (SAE) anaphylactic reaction reported. The sponsor does not believe that changes to the conduct of this study are warranted at this time.  26Sep2022: Embryo fetal development study findings in male rabbits treated with pamrevlumab determined potential risks for pregnant male patients that could cause fetal abnormalities. Per sponsor, there are no recommended changes to safety monitoring, study procedures, or study conduct as a result of this finding. Per PI, no safety or data integrity has been compromised.    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 The sponsor does not believe that changes to the conduct of this study are warranted at this time. xxxxxxxxxxxxxxxxxxxxxxxxxxxxxxxx   Clinical Trials in IPF: Phase 1 study Study FGCL-MC3019-002, n=21 Enrolled 21 subjects with IPF 3 deaths occurred due to disease progression. None of the  deaths were considered related to study drug.  No dose-limiting toxicities All adverse events were considered mild to moderate 76% of subjects experienced at least 1 TEAE The most common TEAEs: Pyrexia (n=3, 14% of subjects) Cough (n=3, 14% of subjects) Dyspnea (n=3, 14% of subjects) Respiratory tract infection (n=2, 9% of subjects)   Phase 2 study Study FGCL-3019-049, n=90 Enrolled 90 subjects with IPF 14 deaths occurred, 13 were deemed to be related to IPF 20% of subjects experienced a TEAE that led to study drug discontinuation IPF and respiratory failure were the two most common reasons for discontinuation; occurring in 8% and 3% of patients, respectively The most common TEAEs: Cough (n=34, 38% of subjects) Dyspnea (n=24, 27% of subjects) Fatigue (n=24, 27% of subjects) Nasopharyngitis (n=20, 22% of subjects) Respiratory tract infection (n=19, 21% of subjects) Bronchitis (n=18, 20% of subjects)   Phase 2 study Study FGCL-3019-067, n=103 103 subjects enrolled with IPF 9 deaths occurred; 4 deemed related to IPF, 5 related to other respiratory causes 18% of subjects experienced a TEAE that led to study drug discontinuation The most common TEAEs: Cough (n=48, 47% of subjects) Respiratory tract infection (n=39, 38% of subjects) IPF (n=32, 31% of subjects) Dysnpea (n=30, 29% of subjects) Sinusitis (n=21, 21% of subjects) Fatigue (n=20, 19% of subjects)  Overall, pamrevlumab has been well tolerated. Infusion-related reactions have been reported at a rate that is consistent with other human monoclonal antibodies. Based on the mechanism of action of pamrevlumab, by inhibiting CTGF, there was some concern that this would cause impaired wound healing or impaired bone fracture healing. However, there were no serious adverse events reported in any study relating to these two issues. Results from studies in subjects with IPF suggested that pamrevlumab slowed the progression of IPF  as  measured by change in ppFVC, quantitative analysis of fibrosis using HRCT, and time to disease progression or death.  [facility] [place] Coordinator note :   This visit for Subject Dylan Shea with DOB: 1951-05-16 on 09/08/2021 for the above protocol is Visit/Encounter # Day 1 Randomization  and is for purpose of research.   The consent for this encounter is under Protocol: Protocol Amendment 1.3 [id number]  IB: version 20 2Nov2022 Main ICF: version [id number] Optional Genetic Consent: version 25Apr2022 OLE ICF: version 25Apr2022 currently IRB approved.   Subject expressed continued interest and consent in continuing as a study subject. Subject confirmed that there was  no change in contact information (e.g. address, telephone, email). Subject thanked for participation in research and contribution to science.  In this visit 09/08/2021 the subject will be evaluated by Sub-Investigator named Rexene Edison, NP  . This research coordinator has verified that the above investigator is  up to date with his/her training logs.   The Subject was informed that the PI Dr. Brand Males continues to have oversight of the subject's visits and course  through relevant discussions, reviews and also specifically of this visit by routing of this note to the [place]. This visit is a key visit of   randomization. The PI is not available for this visit.   Because the PI is NOT available, the sub-I reported and CRC has confirmed that the PI has discussed the visit a-priori with the sub-investigator. In addition, ahead of the key visit of  randomization the visit and subject were  discussed, including I/E criteria,  with the PI  on date of [id number] face to face as part of direct PI oversight.   All procedures were completed per the above named protocol. Subject tolerated infusion well without complaints. Refer to the subjects paper source binder for further details of the visit.    Signed  by Icard Bing, CMA, BS, [place] Coordinator [place], [place] 2:31 PM 09/08/2021

## 2021-09-08 NOTE — Assessment & Plan Note (Signed)
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

## 2021-09-08 NOTE — Assessment & Plan Note (Signed)
Continue research study protocol ?

## 2021-09-08 NOTE — Progress Notes (Signed)
$'@Patient'H$  ID: Dylan Shea, male    DOB: [birth year], 71 y.o.   MRN: [id number]  Chief Complaint  Patient presents with   Follow-up    Referring provider: Neale Burly, MD  HPI: 71 year old for interstitial lung disease  Title: FGCL-3019-091 (FibroGen Study) is a Phase 3, randomized, double-blind, placebo-controlled multicenter international study to evaluate the efficacy and safety of 30 mg/kg IV infusions of pamrevlumab administered every 3 weeks for 52 weeks as compared to placebo in subjects with Idiopathic Pulmonary Fibrosis. Primary end point is: change in FVC from baseline at week 52. ZEPHYRUS STUDY    Protocol #: [id number], Clinical Trials #: [id number] Sponsor: [email or web address]  ([place], [place], [place])  Nature conservation officer Features of Pamrevlumab (FG-3019) the study drug: a recombinant fully human IgG kappa monoclonal antibody that binds to CTGF and is being developed for treatment of diseases in which tissue fibrosis has a major pathogenic role. In particular, pamrevlumab appears to disrupt a CTGF autocrine loop in mesenchymal cells like myofibroblasts that reduces their recruitment of leukocytes like macrophages, mast and dendritic cells via chemokine secretion. This disruption results in collapse of the cellular crosstalk that drives tissue remodeling.  Key Inclusion Criteria:  Age 36 to 27 years Diagnosis of IPF within the past 7 years  Interstitial pulmonary fibrosis defined by HRCT scan at Screening, with evidence of ?10% to <50% parenchymal fibrosis and <25% honeycombing, within the whole lung. Not currently receiving treatment for IPF with approved therapy. a. FVC value ?45% and ?95% of predicted at screening b. DLCO percent of predicted and corrected by Hb value ?35% and ?90% at screening The extent of fibrosis is greater than the extent of emphysema on HRCT. Male subjects with partners of childbearing potential and male subjects of childbearing potential (including  those <1 year postmenopausal) must use double barrier contraception methods during conduct of study, and for 3 months after last dose of study drug.   Key Exclusion Criteria  Previous exposure to pamrevlumab. Ongoing acute IPF exacerbation, or suspicion of such process, during Screening or Randomization; Interstitial lung disease other than IPF; Poorly controlled chronic heart failure; clinical diagnosis of cor pulmonale requiring specific treatment; or severe pulmonary arterial hypertension Smoking within 3 months of Screening and/or unwilling to avoid smoking throughout the study. Use of any investigational drugs, for IPF or not, within 30 days prior to screening initiation. Or use of approved IPF therapies within 1 week prior to screening. High likelihood of lung transplantation within 6 months after Day 1. Any history of malignancy likely to result in significant disability or mortality likely to require significant medical or surgical intervention within the next 2 years. This does not include minor surgical procedures for localized cancer. Daily use of PDE-5 inhibitor drugs [e.g. sildenafil, tadalafil, other]. (Note: Intermittent use of one type for erectile dysfunction or severe pulmonary hypertension is allowed).    Mechanism of Action: Pamrevlumab is a human recombinant IgG monoclonal antibody that binds to connective tissue growth factor (CTGF).  CTGF plays a role by mediating the process of fibrosis. By binding to CTGF, pamrevlumab blocks its biologic activity; thereby preventing cell proliferation, adhesion and migration of growth factors involved in fibrotic changes. It is also being studied in other conditions where fibrotic changes play a role; liver fibrosis, Duchenne muscular dystrophy and certain cancers.   Half-life: The effective terminal elimination half-life at the 30 mg/kg dose level was calculated to be 10.1 days after a single dose and 12.2 days after seven doses in  patients  with IPF (from Study FGCL-3019-049).   Interactions No known drug interactions. All concomitant medications to be reviewed by PI.     Safety Data Investigators Brochure, Edition 19.0 1723 subjects have been exposed to pamrevlumab, 829 with IPF The most common TEAEs in all subjects with IPF: (426 IPF patients have been exposed ? 12 weeks, 326 patients ? 24 weeks and 149 patients ? 52 weeks) Cough, fatigue, dyspnea, upper respiratory tract infection, bronchitis, nasopharyngitis No known effect on qtc prolongation, renal or hepatic issues A total of 29 patients discontinued therapy ( 9 due to disease progression, the other 20 due to  abdominal pain, gastrointestinal hemorrhage, pulmonary embolism, sepsis, renal  failure, and dyspnea.  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 The sponsor does not believe that changes to the conduct of this study are warranted at this time.  26Sep2022: Embryo fetal development study findings in male rabbits treated with pamrevlumab determined potential risks for pregnant male patients that could cause fetal abnormalities. Per sponsor, there are no recommended changes to safety monitoring, study procedures, or study conduct as a result of this finding. Per PI, no safety or data integrity has been compromised.    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 The sponsor does not believe that changes to the  conduct of this study are warranted at this time. xxxxxxxxxxxxxxxxxxxxxxxxxxxxxxxx    Clinical Trials in IPF   Phase 1 study Study FGCL-MC3019-002, n=21 Enrolled 21 subjects with IPF 3 deaths occurred due to disease progression. None of the deaths were considered related to study drug.  No dose-limiting toxicities All adverse events were considered mild to moderate 76% of subjects experienced at least 1 TEAE The most common TEAEs: Pyrexia (n=3, 14% of subjects) Cough (n=3, 14% of subjects) Dyspnea (n=3, 14% of subjects) Respiratory tract infection (n=2, 9% of subjects)   Phase 2 study Study FGCL-3019-049, n=90 Enrolled 90 subjects with IPF 14 deaths occurred, 13 were deemed to be related to IPF 20% of subjects experienced a TEAE that led to study drug discontinuation IPF and respiratory failure were the two most common reasons for discontinuation; occurring in 8% and 3% of patients, respectively The most common TEAEs: Cough (n=34, 38% of subjects) Dyspnea (n=24, 27% of subjects) Fatigue (n=24, 27% of subjects) Nasopharyngitis (n=20, 22% of subjects) Respiratory tract infection (n=19, 21% of subjects) Bronchitis (n=18, 20% of subjects)   Phase 2 study Study FGCL-3019-067, n=103 103 subjects enrolled with IPF 9 deaths occurred; 4 deemed related to IPF, 5 related to other respiratory causes 18% of subjects experienced a TEAE that led to study drug discontinuation The most common TEAEs: Cough (n=48, 49% of subjects) Respiratory tract infection (n=39, 38% of subjects) IPF (n=32, 31% of subjects) Dysnpea (n=30, 29% of subjects) Sinusitis (n=21, 22% of subjects) Fatigue (n=20, 19% of subjects)   Xxxxxxxxxxxxxxxxxxxxx [facility] meeting Aug 2021 - cleared to continue Xxxxxxxxxxxxxx Updates - Sept 2021 - 1 anaphylactic reaction on 10th infusion on DUchenne patient in [place] - causally  related - 2.04% incidence of PE (between IPF 6 patients, and 8 with pancreatic cancer of 685 in studye  with 562 thought to be on IP) - possible cause by IP but sponsor believes no changes to study program - [place] [phone] for protocol to continue without changes xxxxxxxxxxxxxxxxxxxxxxxxxxxxxxxx   Overall, pamrevlumab has been well tolerated. Infusion-related reactions have been reported at a rate that is consistent with other human monoclonal antibodies. Based on the mechanism of action of pamrevlumab, by inhibiting CTGF, there was some concern that this would cause impaired wound healing or impaired bone fracture healing. However, there were no serious adverse events reported in any study relating to these two issues. Results from studies in subjects with IPF suggested that pamrevlumab slowed the progression of IPF as measured by change in ppFVC, quantitative analysis of fibrosis using HRCT, and time to disease progression or death.      09/28/2021 Research Visit : Visit 2 , Day 1 -Randomization  This visit for Subject Dylan Shea with DOB: Dec 21, 1950 on September 28, 2021 for the above protocol is Visit/Encounter # 2  and is for purpose of research  . Subject/LAR expressed continued interest and consent in continuing as a study subject. Subject thanked for participation in research and contribution to science.     No Known Allergies  Immunization History  Administered Date(s) Administered   Fluad Quad(high Dose 65+) 06/02/2021   Moderna Sars-Covid-2 Vaccination 10/14/2019, 11/11/2019   Pneumococcal Polysaccharide-23 04/28/2017   Tdap 11/09/2020    Past Medical History:  Diagnosis Date   Diabetes ([place]) 01/10/2017   GERD (gastroesophageal reflux disease)    Hypercholesteremia    Hypertension     Tobacco History: Social History   Tobacco Use  Smoking Status Never  Smokeless Tobacco Never   Counseling given: Not Answered   Outpatient Medications Prior to Visit  Medication Sig Dispense Refill   albuterol (VENTOLIN HFA) 108 (90 Base) MCG/ACT inhaler Inhale 1-2 puffs into  the lungs every 6 (six) hours as needed. 8 g 2   atorvastatin (LIPITOR) 10 MG tablet Take 10 mg by mouth daily.     clopidogrel (PLAVIX) 75 MG tablet Take 75 mg by mouth daily.     insulin glargine (LANTUS) 100 UNIT/ML injection Inject into the skin daily. Pt thinks he is taking 30 units. Wife gives him his shots     Levothyroxine Sodium 50 MCG CAPS Take by mouth daily before breakfast.     No facility-administered medications prior to visit.         Physical Exam See Research exam    Lab Results:  CBC  BMET   BNP No results found for: BNP  ProBNP No results found for: PROBNP  Imaging: CT Chest High Resolution  Result Date: 08/29/2021 CLINICAL DATA:  History of IPF EXAM: CT CHEST WITHOUT CONTRAST TECHNIQUE: Multidetector CT imaging of the chest was performed following the standard protocol without intravenous contrast. High resolution imaging of the lungs, as well as inspiratory and expiratory imaging, was performed. RADIATION DOSE REDUCTION: This exam was performed according to the departmental dose-optimization program which includes automated exposure control, adjustment of the mA and/or kV according to patient size and/or use of iterative reconstruction technique. COMPARISON:  06/21/2021, 12/04/2020 FINDINGS: Cardiovascular: Aortic atherosclerosis. Normal heart size. Three-vessel coronary artery calcifications. No pericardial effusion. Mediastinum/Nodes: Unchanged prominent mediastinal and hilar lymph nodes. Thyroid gland, trachea, and esophagus demonstrate no significant findings. Lungs/Pleura: Unchanged moderate pulmonary fibrosis in a pattern with slight apical to basal gradient,  featuring irregular peripheral interstitial opacity, septal thickening, areas of traction bronchiectasis and subpleural bronchiolectasis, and areas of honeycombing at the lung bases. No significant air trapping on expiratory phase imaging. No pleural effusion or pneumothorax. Upper Abdomen: No acute  abnormality. Musculoskeletal: No chest wall abnormality. No suspicious osseous lesions identified. IMPRESSION: 1. Unchanged moderate pulmonary fibrosis in a pattern with slight apical to basal gradient, featuring irregular peripheral interstitial opacity, septal thickening, areas of traction bronchiectasis and subpleural bronchiolectasis, and areas of honeycombing at the lung bases. Findings remain consistent with UIP per consensus guidelines: Diagnosis of Idiopathic Pulmonary Fibrosis: An Official ATS/ERS/JRS/ALAT Clinical Practice Guideline. [place], Iss 5, [id number], Mar 03 2017. 2. Unchanged prominent mediastinal and hilar lymph nodes, likely reactive to fibrosis. 3. Coronary artery disease. Aortic Atherosclerosis (ICD10-I70.0). Electronically Signed   By: Delanna Ahmadi M.D.   On: 08/29/2021 10:32      PFT Results Latest Ref Rng & Units 03/25/2021  DLCO uncorrected ml/min/mmHg 21.78  DLCO UNC% % 76  DLCO corrected ml/min/mmHg 22.09  DLCO COR %Predicted % 77  DLVA Predicted % 134  TLC L 5.95  TLC % Predicted % 77  RV % Predicted % 107    No results found for: NITRICOXIDE      Assessment & Plan:   ILD (interstitial lung disease) ([place]) ILD not on oxygen Appears stable continue on current regimen  Research study patient Continue research study protocol     Rexene Edison, NP 09/08/2021

## 2021-09-08 NOTE — Progress Notes (Signed)
Diagnosis: Idiopathic Pulmonary Fibrosis (IPF) ? ?Provider:  Marshell Garfinkel, MD ? ?Procedure: Infusion ? ?IV Type: Peripheral, IV Location: R Forearm ? ?Fibrogen (Pamrevlumab), Dose: 2150 mg ? ?Infusion Start Time: 11.22 am ? ?Infusion Stop Time: 01.18 pm ? ?Post Infusion IV Care: 60 minutes Observation period completed and Peripheral IV Discontinued ? ?Discharge: Condition: Good, Destination: Home . AVS provided to patient.  ? ?Performed by:  Dolly Harbach, RN  ?  ?

## 2021-09-12 ENCOUNTER — Ambulatory Visit (HOSPITAL_COMMUNITY): Payer: Medicare Other

## 2021-09-12 NOTE — Telephone Encounter (Signed)
No data available regarding if pirfenidone can be crushed. I called [employer], manufacturer of brand Esbriet, to determine if they had any data regarding crushing tablets or opening capsules. No data is available through manufacturer regarding increase in adverse effects or change in efficacy. ? ?Reviewed some patient-experience forums on pulmonaryfibrosisnews.com and a handful of patients report crushing and administering through feeding tube though efficacy remains unknown. For these patients, taking medications via feeding tube is their only option. ? ?If patient chooses to move forward with trial of pirfenidone, I'd recommend not crushing pirfenidone tablets due to lack of data ? ?Knox Saliva, PharmD, MPH, BCPS ?Clinical Pharmacist (Rheumatology and Pulmonology) ?

## 2021-09-14 ENCOUNTER — Ambulatory Visit (HOSPITAL_COMMUNITY)
Admission: RE | Admit: 2021-09-14 | Discharge: 2021-09-14 | Disposition: A | Discharge status: Home / Self Care | Payer: Medicare Other | Source: Ambulatory Visit | Attending: Internal Medicine | Admitting: Internal Medicine

## 2021-09-14 ENCOUNTER — Other Ambulatory Visit: Payer: Self-pay

## 2021-09-14 DIAGNOSIS — R1319 Other dysphagia: Secondary | ICD-10-CM | POA: Insufficient documentation

## 2021-09-14 DIAGNOSIS — R1310 Dysphagia, unspecified: Secondary | ICD-10-CM | POA: Diagnosis not present

## 2021-09-14 DIAGNOSIS — T17300A Unspecified foreign body in larynx causing asphyxiation, initial encounter: Secondary | ICD-10-CM | POA: Diagnosis not present

## 2021-09-19 ENCOUNTER — Other Ambulatory Visit (INDEPENDENT_AMBULATORY_CARE_PROVIDER_SITE_OTHER): Payer: Self-pay

## 2021-09-19 DIAGNOSIS — R1319 Other dysphagia: Secondary | ICD-10-CM

## 2021-09-30 ENCOUNTER — Encounter: Payer: Medicare Other | Admitting: *Deleted

## 2021-09-30 ENCOUNTER — Ambulatory Visit (INDEPENDENT_AMBULATORY_CARE_PROVIDER_SITE_OTHER): Payer: Medicare Other

## 2021-09-30 VITALS — BP 152/79 | HR 64 | Temp 97.4°F | Resp 20

## 2021-09-30 DIAGNOSIS — Z006 Encounter for examination for normal comparison and control in clinical research program: Secondary | ICD-10-CM

## 2021-09-30 DIAGNOSIS — J849 Interstitial pulmonary disease, unspecified: Secondary | ICD-10-CM

## 2021-09-30 DIAGNOSIS — J84112 Idiopathic pulmonary fibrosis: Secondary | ICD-10-CM

## 2021-09-30 MED ORDER — STUDY - ZEPHYRUS II - PAMREVLUMAB OR PLACEBO 10 MG/ML IV INFUSION (PI-RAMASWAMY)
30.0000 mg/kg | Freq: Once | INTRAVENOUS | Status: AC
  Administered 2021-09-30: 2150 mg via INTRAVENOUS
  Filled 2021-09-30: qty 215

## 2021-09-30 NOTE — Research (Signed)
Title: FGCL-3019-095 (FibroGen Study) is a Phase 3, randomized, double-blind, placebo-controlled multicenter international study to evaluate the efficacy and safety of 30 mg/kg IV infusions of pamrevlumab administered every 3 weeks for 52 weeks as compared to placebo in subjects with Idiopathic Pulmonary Fibrosis. Primary end point is: change in FVC from baseline at week 52. ZEPHYRUS II STUDY ? ?Protocol #: FGCL-3019-095, Clinical Trials #: [id number] Sponsor: [email or web address]  ?([place], [place], [place]) ? ?Protocol: Protocol Amendment 1.3 [id number] ?IB: version 20 2Nov2022 ?Main ICF: version [id number] ?Optional Genetic Consent: version 25Apr2022 ?OLE ICF: version 25Apr2022 ? ?Key Features of Pamrevlumab (FG-3019) the study drug: a recombinant fully human IgG kappa monoclonal antibody that binds to CTGF and is being developed for treatment of diseases in which tissue fibrosis has a major pathogenic role. In particular, pamrevlumab appears to disrupt a CTGF autocrine loop in mesenchymal cells like myofibroblasts that reduces their recruitment of leukocytes like macrophages, mast and dendritic cells via chemokine secretion. This disruption results in collapse of the cellular crosstalk that drives tissue remodeling. ? ?Key Inclusion Criteria: ?Age 38 to 47 years ?Diagnosis of IPF within the past 7 years  ?Interstitial pulmonary fibrosis defined by HRCT scan at Screening, with evidence of =10% to <50% parenchymal fibrosis and <25% honeycombing, within the whole lung. ?Not currently receiving treatment for IPF with approved therapy. ?a. FVC value =45% and =95% of predicted at screening ?b. DLCO percent of predicted and corrected by Hb value =25% and =90% at screening ?The extent of fibrosis is greater than the extent of emphysema on HRCT. ?Male subjects with partners of childbearing potential and male subjects of childbearing potential (including those <1 year postmenopausal) must use double barrier contraception  methods during conduct of study, and for 3 months after last dose of study drug. ? ? ?Key Exclusion Criteria: ?Previous exposure to pamrevlumab; ?Ongoing acute IPF exacerbation, or suspicion of such process, during Screening or Randomization; ?Interstitial lung disease other than IPF; ?Poorly controlled chronic heart failure; clinical diagnosis of cor pulmonale requiring specific treatment; or severe pulmonary arterial hypertension; ?Smoking within 3 months of Screening and/or unwilling to avoid smoking throughout the study; ?Use of any investigational drugs, for IPF or not, within the 30 days prior to screening initiation. Or use of approved IPF therapies within 1 week prior to screening; ?High likelihood of lung transplantation within 6 months after Day 1; ?Any history of malignancy likely to result in significant disability or mortality likely to require significant medical or surgical intervention within the next 2 years. This does not include minor surgical procedures for localized cancer; ?Daily use of PDE-5 inhibitor drugs [e.g. sildenafil, tadalafil, other]. (Note: Intermittent use of one type for erectile dysfunction or severe pulmonary hypertension is allowed). ? ?Mechanism of Action: ?Pamrevlumab is a human recombinant IgG monoclonal antibody that binds to connective tissue growth factor (CTGF).  CTGF plays a role by mediating the process of fibrosis. By binding to CTGF, pamrevlumab blocks its biologic activity; thereby preventing cell proliferation, adhesion and migration of growth factors involved in fibrotic changes. It is also being studied in other conditions where fibrotic changes play a role; liver fibrosis, Duchenne muscular dystrophy and certain cancers. ?  ?Half-life: ?The effective terminal elimination half-life at the 30 mg/kg dose level was calculated to be  10.1 days after a single dose and 12.2 days after seven doses in patients with IPF (from Study FGCL-3019-049). ?  ?Interactions ?No  known drug interactions. All concomitant medications to be reviewed by PI. ?  ?Safety Data: ?  Investigator?s Brochure, Edition 19.0 ?1199 subjects have been exposed to pamrevlumab, 501 with IPF ?The most common TEAEs in all subjects with IPF: ?(269 IPF patients have been exposed = 12 weeks, 182 patient?s = 24 weeks and 78 patients = 52 weeks) ?Cough, fatigue, dyspnea, upper respiratory tract infection, bronchitis, nasopharyngitis ?No known effect on qtc prolongation, renal or hepatic issues ?A total of 29 patients discontinued therapy ( 9 due to disease progression, the other 20 due to  abdominal pain, gastrointestinal hemorrhage, pulmonary embolism, sepsis, renal  failure, and dyspnea. ? 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  ?06-May-2021: As of this date 2 cases of Serious Adverse Event (SAE) anaphylactic reaction reported. The sponsor does not believe that changes to the conduct of this study are warranted at this time. ? ?26Sep2022: Embryo fetal development study findings in male rabbits treated with pamrevlumab determined potential risks for pregnant male patients that could cause fetal abnormalities. Per sponsor, there are no recommended changes to safety monitoring, study procedures, or study conduct as a result of this finding. Per PI, no safety or data integrity has been compromised.   ? 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 The sponsor does not believe that changes to the conduct of this study are warranted at this  time. ?xxxxxxxxxxxxxxxxxxxxxxxxxxxxxxxx  ? ?Clinical Trials in IPF: ?Phase 1 study Study [id number], n=21 ?Enrolled 21 subjects with IPF ?3 deaths occurred due to disease progression. None of the deaths were considered related to study drug. ? ?No dose-limiting toxicities ?All adverse events were considered mild to moderate ?76% of subjects experienced at least 1 TEAE ?The most common TEAEs: ?Pyrexia (n=3, 14% of subjects) ?Cough (n=3, 14% of subjects) ?Dyspnea (n=3, 14% of subjects) ?Respiratory tract infection (n=2, 9% of subjects) ?  ?Phase 2 study Study FGCL-3019-049, n=90 ?Enrolled 90 subjects with IPF ?14 deaths occurred, 69 were deemed to be related to IPF ?20% of subjects experienced a TEAE that led to study drug discontinuation ?IPF and respiratory failure were the two most common reasons for discontinuation; occurring in 8% and 3% of patients, respectively ?The most common TEAEs: ?Cough (n=34, 38% of subjects) ?Dyspnea (n=24, 27% of subjects) ?Fatigue (n=24, 27% of subjects) ?Nasopharyngitis (n=20, 22% of subjects) ?Respiratory tract infection (n=19, 21% of subjects) ?Bronchitis (n=18, 20% of subjects) ?  ?Phase 2 study Study FGCL-3019-067, n=103 ?103 subjects enrolled with IPF ?9 deaths occurred; 4 deemed related to IPF, 5 related to other respiratory causes ?18% of subjects experienced a TEAE that led to study drug discontinuation ?The most common TEAEs: ?Cough (n=48, 47% of subjects) ?Respiratory tract infection (n=39, 38% of subjects) ?IPF (n=32, 31% of subjects) ?Dysnpea (n=30, 29% of subjects) ?Sinusitis (n=21, 21% of subjects) ?Fatigue (n=20, 19% of subjects) ? ?Overall, pamrevlumab has been well tolerated. Infusion-related reactions have been reported at a rate that is consistent with other human monoclonal antibodies. Based on the mechanism of action of pamrevlumab, by inhibiting CTGF, there was some concern that this would cause impaired wound healing or impaired bone fracture healing.  However, there were no serious adverse events  reported in any study relating to these two issues. Results from studies in subjects with IPF suggested that pamrevlumab slowed the progression of IPF as measured by change in ppFVC, quantitativ

## 2021-09-30 NOTE — Progress Notes (Signed)
Diagnosis: Idiopathic Pulmonary Fibrosis (IPF) ? ?Provider:  Marshell Garfinkel, MD ? ?Procedure: Infusion ? ?IV Type: Peripheral, IV Location: R Antecubital ? ?Fibrogen (Pamrevlumab), Dose: 2150 mg ? ?Infusion Start Time: 10.30 ? ?Infusion Stop Time: 11.38 ? ?Post Infusion IV Care: Observation period completed and Peripheral IV Discontinued ? ?Discharge: Condition: Good, Destination: Home . AVS provided to patient.  ? ?Performed by:  Ona Roehrs, RN  ?  ?

## 2021-10-20 ENCOUNTER — Encounter: Payer: Medicare Other | Admitting: *Deleted

## 2021-10-20 ENCOUNTER — Ambulatory Visit (INDEPENDENT_AMBULATORY_CARE_PROVIDER_SITE_OTHER): Payer: Medicare Other

## 2021-10-20 VITALS — BP 169/78 | HR 54 | Temp 97.6°F | Resp 20

## 2021-10-20 DIAGNOSIS — Z006 Encounter for examination for normal comparison and control in clinical research program: Secondary | ICD-10-CM

## 2021-10-20 DIAGNOSIS — J84112 Idiopathic pulmonary fibrosis: Secondary | ICD-10-CM

## 2021-10-20 DIAGNOSIS — J849 Interstitial pulmonary disease, unspecified: Secondary | ICD-10-CM

## 2021-10-20 MED ORDER — STUDY - ZEPHYRUS II - PAMREVLUMAB OR PLACEBO 10 MG/ML IV INFUSION (PI-RAMASWAMY)
30.0000 mg/kg | Freq: Once | INTRAVENOUS | Status: AC
  Administered 2021-10-20: 2150 mg via INTRAVENOUS
  Filled 2021-10-20: qty 215

## 2021-10-20 NOTE — Research (Signed)
Title: FGCL-3019-095 (FibroGen Study) is a Phase 3, randomized, double-blind, placebo-controlled multicenter international study to evaluate the efficacy and safety of 30 mg/kg IV infusions of pamrevlumab administered every 3 weeks for 52 weeks as compared to placebo in subjects with Idiopathic Pulmonary Fibrosis. Primary end point is: change in FVC from baseline at week 52. ZEPHYRUS II STUDY ? ?Protocol #: FGCL-3019-095, Clinical Trials #: [id number] Sponsor: [email or web address]  ?([place], [place], [place]) ? ?[facility] [place] Coordinator note :  ? ?This visit for Subject Taeshawn Helfman with DOB: 12/03/1950 on 10/20/2021 for the above protocol is Visit/Encounter # Week 6 and is for purpose of research.  ? ?The consent for this encounter is under Protocol: Protocol Amendment 2.1 (27Feb23) ?IB: version 19 [id number] ?Main ICF: version 03Apr23   ?Optional Genetic Consent: version 03Apr23 ?OLE ICF: version 03Apr23 ? ?Subject expressed continued interest and consent in continuing as a study subject. Subject confirmed that there was no change in contact information (e.g. address, telephone, email). Subject thanked for participation in research and contribution to science.  In this visit 10/20/2021 the subject will be re consented to revised protocol amendments by study coordinator Lytle Michaels and by Sub-Investigator named Dr. Unice Cobble. This research coordinator has verified that the above investigator is up to date with his training logs.  ? ?The Subject was informed that the PI Dr. Chase Caller continues to have oversight of the subject's visits and course  through relevant discussions, reviews and also specifically of this visit by routing of this note to the [place]. ? ?The patient appeared well today. He was signed consent for revised protocol amendments for the main study and optional sub-study. Sub-investigator Dr. Unice Cobble participated in the consent process. The patient received and  tolerated the infusion well. Please refer to paper source subject binder for further details regarding the consent process and protocol assessments conducted this visit.  ? ? ? ? ?Signed by ? ?Lytle Michaels ?Clinical Research Coordinator ?[facility]  ?[place], [place] ?11:47 AM 10/20/2021  ?  ?

## 2021-10-20 NOTE — Progress Notes (Signed)
Subject Dylan Shea, DOB 09/25/1950, was seen  10/20/2021 for documentation of consent for participation in Clinical Trial study/Protocol #FGCL-3019-095 (FibroGen Study) ?All questions answered. ?No exam performed.                                                 Hendricks Limes MD,SI ? ?

## 2021-10-20 NOTE — Progress Notes (Signed)
Diagnosis: Idiopathic Pulmonary Fibrosis (IPF) ? ?Provider:  Marshell Garfinkel, MD ? ?Procedure: Infusion ? ?IV Type: Peripheral, IV Location: L Forearm ? ?Zephyrus (pamrevlumab), Dose: '2150mg'$  ? ?Infusion Start Time: 6834 ? ?Infusion Stop Time: 1110 ? ?Post Infusion IV Care: Observation period completed and Peripheral IV Discontinued ? ?Discharge: Condition: Good, Destination: Home .   ? ?Performed by:  Koren Shiver, RN  ?  ?

## 2021-10-21 ENCOUNTER — Encounter: Payer: Self-pay | Admitting: Internal Medicine

## 2021-10-21 ENCOUNTER — Telehealth: Payer: Self-pay | Admitting: Internal Medicine

## 2021-10-21 DIAGNOSIS — I152 Hypertension secondary to endocrine disorders: Secondary | ICD-10-CM | POA: Insufficient documentation

## 2021-10-21 DIAGNOSIS — I10 Essential (primary) hypertension: Secondary | ICD-10-CM | POA: Insufficient documentation

## 2021-10-21 NOTE — Telephone Encounter (Signed)
Phone call was made to Dr. Joselyn Arrow office on 21-Oct-2021 at 11:00 a.m. as continuity of care. I spoke with Amber at the front desk regarding Dylan Shea's most recent BP readings during his research visits at the infusion center. ?09/08/2021: pre infusion 154/78, post infusion 166/76 ?09/30/21: pre infusion 152/79, post infusion 175/75, recheck 154/72 ?10/20/21: pre infusion 169/78, post infusion 184/90, and 2 rechecks of 174/71 & 183/83 ?During the research encounters, the patient denied any complaints of chest pain, shortness of breath, headaches, dizziness, or blurred vision. Amber confirmed documentation of the above readings in the patient's chart, and that she would notify Dr. Sherrie Sport. ?

## 2021-10-21 NOTE — Telephone Encounter (Signed)
PI OVERSIGHT ATTESTATION ? ?I the Principal Investigator (PI) for the above mentioned study attest that I reviewed the mentioned  clinical research coordinator  notes on research subject  Dylan Shea  born Nov 14, 1950 . I  agree with the findings mentioned above ? ? ?Dr.Amera Banos Chase Caller, MD ?Pulmonary and Critical Care Medicine ?Research Investigator & Staff Physician ?[facility] ?[facility] ? ?[facility] ?Pager: [phone], If no answer or between  15:00h - 7:00h: call [phone] ? ?10/21/2021 ?12:29 PM ? ? ? ? ? ?

## 2021-11-09 DIAGNOSIS — E1169 Type 2 diabetes mellitus with other specified complication: Secondary | ICD-10-CM | POA: Diagnosis not present

## 2021-11-09 DIAGNOSIS — M818 Other osteoporosis without current pathological fracture: Secondary | ICD-10-CM | POA: Diagnosis not present

## 2021-11-09 DIAGNOSIS — J849 Interstitial pulmonary disease, unspecified: Secondary | ICD-10-CM | POA: Diagnosis not present

## 2021-11-09 DIAGNOSIS — M5459 Other low back pain: Secondary | ICD-10-CM | POA: Diagnosis not present

## 2021-11-09 DIAGNOSIS — I10 Essential (primary) hypertension: Secondary | ICD-10-CM | POA: Diagnosis not present

## 2021-11-09 DIAGNOSIS — E7849 Other hyperlipidemia: Secondary | ICD-10-CM | POA: Diagnosis not present

## 2021-11-09 DIAGNOSIS — E038 Other specified hypothyroidism: Secondary | ICD-10-CM | POA: Diagnosis not present

## 2021-11-09 DIAGNOSIS — E46 Unspecified protein-calorie malnutrition: Secondary | ICD-10-CM | POA: Diagnosis not present

## 2021-11-09 DIAGNOSIS — Z0000 Encounter for general adult medical examination without abnormal findings: Secondary | ICD-10-CM | POA: Diagnosis not present

## 2021-11-09 DIAGNOSIS — R7303 Prediabetes: Secondary | ICD-10-CM | POA: Diagnosis not present

## 2021-11-09 DIAGNOSIS — Z681 Body mass index (BMI) 19 or less, adult: Secondary | ICD-10-CM | POA: Diagnosis not present

## 2021-11-11 ENCOUNTER — Ambulatory Visit (INDEPENDENT_AMBULATORY_CARE_PROVIDER_SITE_OTHER): Payer: Medicare Other

## 2021-11-11 ENCOUNTER — Encounter: Payer: Medicare Other | Admitting: *Deleted

## 2021-11-11 VITALS — BP 130/73 | HR 66 | Temp 97.3°F | Resp 26

## 2021-11-11 DIAGNOSIS — Z006 Encounter for examination for normal comparison and control in clinical research program: Secondary | ICD-10-CM

## 2021-11-11 DIAGNOSIS — J84112 Idiopathic pulmonary fibrosis: Secondary | ICD-10-CM

## 2021-11-11 DIAGNOSIS — J849 Interstitial pulmonary disease, unspecified: Secondary | ICD-10-CM

## 2021-11-11 MED ORDER — STUDY - ZEPHYRUS II - PAMREVLUMAB OR PLACEBO 10 MG/ML IV INFUSION (PI-RAMASWAMY)
30.0000 mg/kg | Freq: Once | INTRAVENOUS | Status: AC
  Administered 2021-11-11: 2150 mg via INTRAVENOUS
  Filled 2021-11-11: qty 215

## 2021-11-11 NOTE — Research (Signed)
Title: FGCL-3019-095 (FibroGen Study) is a Phase 3, randomized, double-blind, placebo-controlled multicenter international study to evaluate the efficacy and safety of 30 mg/kg IV infusions of pamrevlumab administered every 3 weeks for 52 weeks as compared to placebo in subjects with Idiopathic Pulmonary Fibrosis. Primary end point is: change in FVC from baseline at week 52. ZEPHYRUS II STUDY  Protocol #: [id number]-3019-095, Clinical Trials #: [id number] Sponsor: [email or web address]  ([place], [place], [place])  [facility] [employer] Coordinator note :   This visit for Subject Dylan Shea with DOB: 11/18/1950 on 11/11/2021 for the above protocol is Visit/Encounter # Week 9 and is for purpose of research.   The consent for this encounter is under Protocol: Protocol Amendment 2.1 (27Feb23) IB: version 19 [id number] Main ICF: version 03Apr23   Optional Genetic Consent: version 03Apr23 OLE ICF: version 03Apr23  Subject expressed continued interest and consent in continuing as a study subject. Subject confirmed that there was no change in contact information (e.g. address, telephone, email). Subject thanked for participation in research and contribution to science.   The Subject was informed that the PI Dr. Chase Caller continues to have oversight of the subject's visits and course  through relevant discussions, reviews and also specifically of this visit by routing of this note to the [place].   The subject appeared well today. He reported no new complaints since his last research visit. He received and tolerated the infusion well today. Please refer to paper source subject binder for further details.     Signed by  [place] Coordinator  [facility]  [place], [place] 11:22 AM 11/11/2021

## 2021-11-11 NOTE — Progress Notes (Signed)
Diagnosis: Interstitial Lung Disease/Research ? ?Provider:  Marshell Garfinkel, MD ? ?Procedure: Infusion ? ?IV Type: Peripheral, IV Location: R Forearm ? ?Zephyrus II (pamrevlumab), Dose: '2150mg'$  ? ?Infusion Start Time: 1010 ? ?Infusion Stop Time: 1448 ? ?Post Infusion IV Care: Observation period completed and Peripheral IV Discontinued ? ?Discharge: Condition: Good, Destination: Home .  ? ?Performed by:  Koren Shiver, RN  ?  ?

## 2021-12-01 ENCOUNTER — Ambulatory Visit (INDEPENDENT_AMBULATORY_CARE_PROVIDER_SITE_OTHER): Payer: Medicare Other

## 2021-12-01 ENCOUNTER — Encounter: Payer: Medicare Other | Admitting: *Deleted

## 2021-12-01 VITALS — BP 110/70 | HR 63 | Temp 97.4°F | Resp 24 | Wt 150.0 lb

## 2021-12-01 VITALS — BP 110/70 | HR 63 | Temp 97.4°F | Resp 24

## 2021-12-01 DIAGNOSIS — J84112 Idiopathic pulmonary fibrosis: Secondary | ICD-10-CM

## 2021-12-01 DIAGNOSIS — Z006 Encounter for examination for normal comparison and control in clinical research program: Secondary | ICD-10-CM

## 2021-12-01 DIAGNOSIS — J849 Interstitial pulmonary disease, unspecified: Secondary | ICD-10-CM

## 2021-12-01 MED ORDER — STUDY - ZEPHYRUS II - PAMREVLUMAB OR PLACEBO 10 MG/ML IV INFUSION (PI-RAMASWAMY)
30.0000 mg/kg | Freq: Once | INTRAVENOUS | Status: AC
  Administered 2021-12-01: 2170 mg via INTRAVENOUS
  Filled 2021-12-01: qty 217

## 2021-12-01 NOTE — Research (Signed)
Title: FGCL-3019-095 (FibroGen Study) is a Phase 3, randomized, double-blind, placebo-controlled multicenter international study to evaluate the efficacy and safety of 30 mg/kg IV infusions of pamrevlumab administered every 3 weeks for 52 weeks as compared to placebo in subjects with Idiopathic Pulmonary Fibrosis. Primary end point is: change in FVC from baseline at week 52. ZEPHYRUS II STUDY  Protocol #: [id number]-3019-095, Clinical Trials #: [id number] Sponsor: [email or web address]  ([place], [place], [place])  [facility] [employer] Coordinator note :   This visit for Subject Dylan Shea with DOB: 03/02/1951 on 12/01/2021 for the above protocol is Visit/Encounter # Week 12 and is for purpose of research.   The consent for this encounter is under Protocol: Protocol Amendment 2.1 (27Feb23) IB: version 19 [id number] Main ICF: version 03Apr23   Optional Genetic Consent: version 03Apr23 OLE ICF: version 03Apr23  Subject expressed continued interest and consent in continuing as a study subject. Subject confirmed that there was no change in contact information (e.g. address, telephone, email). Subject thanked for participation in research and contribution to science.  In this visit 12/01/2021 the subject will be evaluated by Sub-Investigator named Dr. Unice Cobble. This research coordinator has verified that the above investigator is is up to date with his training logs.   The Subject was informed that the PI Dr. Brand Males continues to have oversight of the subject's visits and course  through relevant discussions, reviews and also specifically of this visit by routing of this note to the [facility].  The patient appeared well today. He reported improved blood pressure readings at home since he started the new blood pressure medication. He has no other complaints. He received and tolerated the infusion well today. Please refer to paper source subject binder for further details.     Signed  by  Lytle Michaels Clinical Research Coordinator [facility]  [place], [place] 12:37 PM 12/01/2021

## 2021-12-01 NOTE — Progress Notes (Signed)
Diagnosis: Interstitial Lung disease   Provider:  Marshell Garfinkel, MD  Procedure: Infusion  IV Type: Peripheral, IV Location: L Forearm  Zephyrus II, Dose: '10mg'$   Infusion Start Time: 9924  Infusion Stop Time: 1136  Post Infusion IV Care: Observation period completed and Peripheral IV Discontinued  Discharge: Condition: Good, Destination: Home . AVS provided to patient.   Performed by:  Adelina Mings, LPN

## 2021-12-01 NOTE — Progress Notes (Signed)
Dylan Shea ,DOB 10-28-1950, was seen 01/June/2023 as subject in a clinical trial /Protocol # [id number]. Cardiopulmonary symptoms are stable Other or new symptoms denied. Pertinent physical findings include: Voice is raspy and hoarse.  Temporal wasting is present.  He has a brief raspy systolic murmur at the left base.  First heart sound is increased at the apex.  He has minor crackles at the right lower lobe. He has irregular vitiliginous scarring over the forearms.  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  There are flexion contractions of the right third through fifth fingers and slight contracture of the fifth left finger. All physical findings NCS                                                                     Hendricks Limes MD,SI

## 2021-12-14 ENCOUNTER — Ambulatory Visit: Payer: Medicare Other | Admitting: Adult Health

## 2021-12-21 MED ORDER — STUDY - ZEPHYRUS II - PAMREVLUMAB OR PLACEBO 10 MG/ML IV INFUSION (PI-RAMASWAMY): 30.0000 mg/kg | Freq: Once | INTRAVENOUS | Status: DC

## 2021-12-22 NOTE — Progress Notes (Unsigned)
xxxxxxxxxxxxxxxxxxxxxxxxxxxxxxxxxxxxxxxxxxxxxxxxxxxxxxxxxxxxxxxxxx OV 08/08/2021 -referred to Dr. Chase Caller by Dr. Halford Chessman  for pulmonary fibrosis comanagement  Subjective:  Patient ID: Dylan Shea, male , DOB: [birth year] , age 71 y.o. , MRN: [id number] , ADDRESS: [place] [address] PCP Neale Burly, MD Patient Care Team: Neale Burly, MD as PCP - General (Internal Medicine)  This Provider for this visit: Treatment Team:  Attending Provider: Brand Males, MD    08/08/2021 -   Chief Complaint  Patient presents with   Follow-up    Patient is here switching from Dr. Halford Chessman to MR for ILD.  Pt states that he does have complaints of SOB when he exerts himself and also has an occasional cough.     HPI Dylan Shea 71 y.o. -patient presents with his wife Tomi Bamberger and son Octavia Bruckner.  He lives in the [place] area.  He has pulmonary fibrosis diagnosed by Dr. Halford Chessman and therefore he has been referred here.  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  He retired in 1996.  Also in the background for several years he has been dealing with cervical spurs and has chronic hoarseness of voice associated with dysphagia for the last 2 years.  He has been aspirating food.  Review of the records indicate that this was even there in 2019.  He sees GI in [place].  This was progressively getting worse.  He cannot take pills.  He has to crush it.  Then in March 2022 on 1 particular day he had 2 falls.  The unsure why.  There was another fall this time with a trip and fracture to the collarbone in May 2022.  They suspect this might have been because of a TIA that was diagnosed in June 2022.  CT scan of the chest at that time showed some pneumomediastinum but also showed pulmonary fibrosis.  This the first time he had a CT scan of the chest.  This the first time he became aware he has pulmonary fibrosis.  He subsequently established with  Dr. Halford Chessman.  Around this time he started having insidious onset of fatigue also worsening shortness of breath and progressive weight loss.  All this has progressed.  In the last 2 or 3 months he has lost close to 20 pounds of weight.  He was weighing 171 pounds 2-3 months ago and currently weighs 154 pounds.  In December 2022 he had COVID and since then has been an acceleration in fatigue and shortness of breath the COVID was an outpatient COVID.  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  He has had autoimmune serology and this is negative for any autoimmune disease  ILD question is as below.   [place] Integrated Comprehensive ILD Questionnaire  Symptoms:  Shortness of breath insidious.  Particular in the last 10 months.  Progressively getting worse.    Past Medical History :  -COVID in December 2022 treated as outpatient Severe dysphagia-with aspiration ongoing sees GI. -No connective tissue disease -Falls and TIA in May/June 2022 -Has cervical spurs and chronic hoarseness of voice and also dysphagia with resultant aspiration   ROS:  -Hoarseness of voice because of cervical spurs -Arthralgia -Severe fatigue -Unintentional weight loss 20-40 pounds in 6 months -Acid reflux disease -Failure to thrive  FAMILY HISTORY of LUNG DISEASE:  -Only he has pulmonary fibrosis nobody in the else in the family has pulmonary fibrosis or any  other lung disease  PERSONAL EXPOSURE HISTORY:  -Did not smoke.  He did do marijuana very little in 1974.  No cocaine use no intravenous drug use.  HOME  EXPOSURE and HOBBY DETAILS :  [place].  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  Otherwise exposure history is negative  PULMONARY TOXICITY HISTORY (27 items):  -Denies  INVESTIGATIONS: Below  -PFTs with mild to moderate restriction -CT scan with classic UIP personally visualized and shown to the family -Serology negative -Has prior aspiration 2019     CT Chest data  No results found.    PFT  PFT Results Latest Ref Rng & Units 03/25/2021  DLCO uncorrected ml/min/mmHg 21.78  DLCO UNC% % 76  DLCO corrected ml/min/mmHg 22.09  DLCO COR %Predicted % 77  DLVA Predicted % 134  TLC L 5.95  TLC % Predicted % 77  RV % Predicted % 107     Latest Reference Range & Units 02/25/21 10:47  Anti Nuclear Antibody (ANA) Negative  Negative  Atypical pANCA Neg:<1:20 titer <1:20  RA Latex Turbid. <14.0 IU/mL <10.0  Cytoplasmic (C-ANCA) Neg:<1:20 titer <1:20  P-ANCA Neg:<1:20 titer <1:20  Anti-MPO Antibodies 0.0 - 0.9 units <0.2  Anti-PR3 Antibodies 0.0 - 0.9 units <0.2  Scleroderma (Scl-70) (ENA) Antibody, IgG 0.0 - 0.9 AI <0.2    CT CHEST HIGH RES 06/21/21  IMPRESSION: 1. Moderate pulmonary fibrosis in a pattern with slight apical to basal gradient, featuring irregular peripheral interstitial opacity, septal thickening, and areas of traction bronchiectasis and subpleural bronchiolectasis with probable honeycombing at the lung bases. Fibrotic findings are not significantly changed compared to relatively recent prior examination dated 12/04/2020. Findings are consistent with UIP per consensus guidelines: Diagnosis of Idiopathic Pulmonary Fibrosis: An Official ATS/ERS/JRS/ALAT Clinical Practice Guideline. [place], Iss 5, [phone], Mar 03 2017. 2. Numerous prominent mediastinal lymph nodes, likely reactive to fibrosis. 3. Coronary artery disease.   Aortic Atherosclerosis (ICD10-I70.0).     Electronically Signed   By: Delanna Ahmadi M.D.   On: 06/22/2021 09:44   2019 SWALLOW STUDY 04/15/18       HPI  Dylan Shea is  a 71 yo male who was referred by Deberah Castle and Dr. Laural Golden for MBSS due to suspected oropharyngeal dysphagia. Pt had EGD in Oct 2018 with empirical dilation, which relieved Pt's symptoms short term. His wife states that Pt is choking on everything. He had a Ba Swallow on 04/15/2018 which showed: Laryngeal penetration and significant aspiration of contrast into the trachea, RIGHT mainstem bronchus and into RIGHT lower lobe. Markedly delayed and diminished spontaneous cough reflex with poor clearance of aspirated barium. No esophageal stricture identified, with 12.5 mm diameter barium tablet able to passed oral cavity without obstruction. He was accompanied to today's MBSS by his wife.    IMPRESSION: Laryngeal penetration and significant aspiration of contrast into the trachea, RIGHT mainstem bronchus and into RIGHT lower lobe.   Markedly delayed and diminished spontaneous cough reflex with poor clearance of aspirated barium.   No esophageal stricture identified, with 12.5 mm diameter barium tablet able to passed oral cavity without obstruction.   Findings  called to Deberah Castle NP on 04/15/2018 at 1049 hours.     Electronically Signed   By: Lavonia Dana M.D.   On: 04/15/2018 10:51   OV 08/30/2021  Subjective:  Patient ID: Dylan Shea, male , DOB: 10-08-1950 , age 49 y.o. , MRN: [id number] , ADDRESS: [place] [address] PCP Neale Burly, MD Patient Care Team: Neale Burly, MD as PCP - General (Internal Medicine)  This Provider for this visit: Treatment Team:  Attending Provider: Brand Males, MD    08/30/2021 -  IPF followup   Type of visit: Telephone Circumstance: COVID-19 national emergency Identification of patient Dylan Shea with 1951/04/07 and MRN [id number] - 2 person identifier Risks: Risks, benefits, limitations of telephone visit explained. Patient understood and verbalized agreement to proceed Anyone else on call: jsut hiome Patient  location: his home This provider location: [address], [address]; [place]; [place] [address]. [facility]. [place] 71 y.o. -initially set up as a video visit but had to be converted to a telephone visit because he did not show up for the video visit.  I then called him.  He said his wife is not at home.  His family is not at home.  He does not know how to do a video visit.  So we did this as a telephone visit.  He tells me that since last visit he has gained 5 pounds of weight because he is eating well.  On 08/12/2021 he did do informed consent for this Zepyrus 095 research study involving IV monoclonal antibody against connective tissue growth factor versus placebo.  He has now had high-resolution CT chest.  He did do a spirometry and DLCO and he did screen past from that perspective.  The high-resolution CT chest done below was under the research protocol.  In my personal visualization this is classic UIP.  I agree with the radiologist who is that this is classic UIP.  It is now being submitted to the [facility] folks for blinded independent radiology read.  He and his family at this point have opted against standard of care antifibrotic's particularly given his weight loss and fatigue and GI issues.  They will reconsider this at a future time.  I discussed via secure chat with Dr. Laural Golden his GI doctor who he saw today.  Esophagram is planned today.  A colonoscopy needs to be done at some point in the future due to tubular adenoma removal in 2012  CT Chest data - HRCT   CT Chest High Resolution  Result Date: 08/29/2021 CLINICAL DATA:  History of IPF EXAM: CT CHEST WITHOUT CONTRAST TECHNIQUE: Multidetector CT imaging of the chest was performed following the standard protocol without intravenous contrast. High resolution imaging of the lungs, as well as inspiratory and expiratory imaging, was performed. RADIATION DOSE REDUCTION: This exam was performed  according to the departmental dose-optimization program which includes automated exposure control, adjustment of the mA and/or kV according to patient size and/or use of iterative reconstruction technique. COMPARISON:  06/21/2021, 12/04/2020 FINDINGS: Cardiovascular: Aortic atherosclerosis. Normal heart size. Three-vessel coronary artery calcifications. No pericardial effusion. Mediastinum/Nodes: Unchanged prominent mediastinal and hilar lymph nodes. Thyroid gland, trachea, and esophagus demonstrate no significant findings. Lungs/Pleura: Unchanged moderate pulmonary fibrosis in a pattern with slight apical to basal gradient, featuring irregular peripheral interstitial opacity, septal thickening, areas of traction bronchiectasis and subpleural bronchiolectasis, and areas of honeycombing at the lung bases. No significant air  trapping on expiratory phase imaging. No pleural effusion or pneumothorax. Upper Abdomen: No acute abnormality. Musculoskeletal: No chest wall abnormality. No suspicious osseous lesions identified. IMPRESSION: 1. Unchanged moderate pulmonary fibrosis in a pattern with slight apical to basal gradient, featuring irregular peripheral interstitial opacity, septal thickening, areas of traction bronchiectasis and subpleural bronchiolectasis, and areas of honeycombing at the lung bases. Findings remain consistent with UIP per consensus guidelines: Diagnosis of Idiopathic Pulmonary Fibrosis: An Official ATS/ERS/JRS/ALAT Clinical Practice Guideline. [place], Iss 5, [phone], Mar 03 2017. 2. Unchanged prominent mediastinal and hilar lymph nodes, likely reactive to fibrosis. 3. Coronary artery disease. Aortic Atherosclerosis (ICD10-I70.0). Electronically Signed   By: Delanna Ahmadi M.D.   On: 08/29/2021 10:32      PFT  PFT Results Latest Ref Rng & Units 03/25/2021  DLCO uncorrected ml/min/mmHg 21.78  DLCO UNC% % 76  DLCO corrected ml/min/mmHg 22.09  DLCO COR %Predicted % 77   DLVA Predicted % 134  TLC L 5.95  TLC % Predicted % 77  RV % Predicted % 107         OV 12/22/2021  Subjective:  Patient ID: Dylan Shea, male , DOB: 02-09-1951 , age 36 y.o. , MRN: [id number] , ADDRESS: [place] [address] PCP Neale Burly, MD Patient Care Team: Neale Burly, MD as PCP - General (Internal Medicine)  This Provider for this visit: Treatment Team:  Attending Provider: Brand Males, MD    12/22/2021 -  No chief complaint on file.    SYMPTOM SCALE - ILD 08/08/2021  Current weight   O2 use ra  Shortness of Breath 0 -> 5 scale with 5 being worst (score 6 If unable to do)  At rest 0  Simple tasks - showers, clothes change, eating, shaving 5  Household (dishes, doing bed, laundry) 6  Shopping 6  Walking level at own pace 5  Walking up Stairs 6  Total (30-36) Dyspnea Score 28      Non-dyspnea symptoms (0-> 5 scale) 08/08/2021  How bad is your cough? 5  How bad is your fatigue 5  How bad is nausea 0  How bad is vomiting?  0  How bad is diarrhea? 0  How bad is anxiety? 0  How bad is depression 0  Any chronic pain - if so where and how bad Hands and back   HPI Dylan Shea 71 y.o. -    CT Chest data  No results found.    PFT     Latest Ref Rng & Units 03/25/2021    2:41 PM  PFT Results  DLCO uncorrected ml/min/mmHg 21.78   DLCO UNC% % 76   DLCO corrected ml/min/mmHg 22.09   DLCO COR %Predicted % 77   DLVA Predicted % 134   TLC L 5.95   TLC % Predicted % 77   RV % Predicted % 107        has a past medical history of Diabetes ([place]) (01/10/2017), GERD (gastroesophageal reflux disease), Hypercholesteremia, and Hypertension.   reports that he has never smoked. He has never used smokeless tobacco.  Past Surgical History:  Procedure Laterality Date   COLONOSCOPY     ESOPHAGEAL DILATION N/A 04/26/2017   Procedure: ESOPHAGEAL DILATION;  Surgeon: Rogene Houston, MD;  [facility];  Service:  Endoscopy;  Laterality: N/A;   ESOPHAGOGASTRODUODENOSCOPY N/A 04/26/2017   Procedure: ESOPHAGOGASTRODUODENOSCOPY (EGD);  Surgeon: Rogene Houston, MD;  Location: [facility];  Service: Endoscopy;  Laterality: N/A;  12:45    No Known Allergies  Immunization History  Administered Date(s) Administered   Fluad Quad(high Dose 65+) 06/02/2021   Moderna Sars-Covid-2 Vaccination 10/14/2019, 11/11/2019   Pneumococcal Polysaccharide-23 04/28/2017   Tdap 11/09/2020    Family History  Problem Relation Age of Onset   Emphysema Mother      Current Outpatient Medications:    albuterol (VENTOLIN HFA) 108 (90 Base) MCG/ACT inhaler, Inhale 1-2 puffs into the lungs every 6 (six) hours as needed., Disp: 8 g, Rfl: 2   atorvastatin (LIPITOR) 10 MG tablet, Take 10 mg by mouth daily., Disp: , Rfl:    clopidogrel (PLAVIX) 75 MG tablet, Take 75 mg by mouth daily., Disp: , Rfl:    insulin glargine (LANTUS) 100 UNIT/ML injection, Inject into the skin daily. Pt thinks he is taking 30 units. Wife gives him his shots, Disp: , Rfl:    Levothyroxine Sodium 50 MCG CAPS, Take by mouth daily before breakfast., Disp: , Rfl:    STUDY - ZEPHYRUS II - pamrevlumab or placebo 10 mg/mL IV infusion (PI-Zadok Holaway), Inject 30 mg/kg into the vein once. For investigational use only - Administration rate per protocol. For IV administration only in a dedicated line with a 0.2 micron in-line filer.Given every 3 weeks in clinic., Disp: , Rfl:   Current Facility-Administered Medications:    [START ON 12/23/2021] STUDY - ZEPHYRUS II - pamrevlumab or placebo 10 mg/mL IV infusion (PI-Derrion Tritz), 30 mg/kg (Order-Specific), Intravenous, Once, Brand Males, MD      Objective:   There were no vitals filed for this visit.  Estimated body mass index is 19.79 kg/m as calculated from the following:   Height as of 08/30/21: '6\' 1"'$  (1.854 m).   Weight as of 12/01/21: 150 lb (68 kg).  '@WEIGHTCHANGE'$ @  There were no vitals filed for  this visit.   Physical Exam  General Appearance:    Alert, cooperative, no distress, appears stated age - *** , Deconditioned looking - *** , OBESE  - ***, Sitting on Wheelchair -  ***  Head:    Normocephalic, without obvious abnormality, atraumatic  Eyes:    PERRL, conjunctiva/corneas clear,  Ears:    Normal TM's and external ear canals, both ears  Nose:   Nares normal, septum midline, mucosa normal, no drainage    or sinus tenderness. OXYGEN ON  - *** . Patient is @ ***   Throat:   Lips, mucosa, and tongue normal; teeth and gums normal. Cyanosis on lips - ***  Neck:   Supple, symmetrical, trachea midline, no adenopathy;    thyroid:  no enlargement/tenderness/nodules; no carotid   bruit or JVD  Back:     Symmetric, no curvature, ROM normal, no CVA tenderness  Lungs:     Distress - *** , Wheeze ***, Barrell Chest - ***, Purse lip breathing - ***, Crackles - ***   Chest Wall:    No tenderness or deformity.    Heart:    Regular rate and rhythm, S1 and S2 normal, no rub   or gallop, Murmur - ***  Breast Exam:    NOT DONE  Abdomen:     Soft, non-tender, bowel sounds active all four quadrants,    no masses, no organomegaly. Visceral obesity - ***  Genitalia:   NOT DONE  Rectal:   NOT DONE  Extremities:   Extremities - normal, Has Cane - ***, Clubbing - ***, Edema - ***  Pulses:   2+ and symmetric all  extremities  Skin:   Stigmata of Connective Tissue Disease - ***  Lymph nodes:   Cervical, supraclavicular, and axillary nodes normal  Psychiatric:  Neurologic:   Pleasant - ***, Anxious - ***, Flat affect - ***  CAm-ICU - neg, Alert and Oriented x 3 - yes, Moves all 4s - yes, Speech - normal, Cognition - intact    General: No distress. *** Neuro: Alert and Oriented x 3. GCS 15. Speech normal Psych: Pleasant Resp:  Barrel Chest - ***.  Wheeze - ***, Crackles - ***, No overt respiratory distress CVS: Normal heart sounds. Murmurs - *** Ext: Stigmata of Connective Tissue Disease -  *** HEENT: Normal upper airway. PEERL +. No post nasal drip        Assessment:     No diagnosis found.     Plan:     There are no Patient Instructions on file for this visit.    SIGNATURE    Dr. Brand Males, M.D., F.C.C.P,  Pulmonary and Critical Care Medicine Staff Physician, [place] Director - Interstitial Lung Disease  Program  P[facility] [place] [facility] [place], [place], [address]  Pager: [phone], If no answer or between  15:00h - 7:00h: call [phone] Telephone: [phone]  6:42 PM 12/22/2021

## 2021-12-23 ENCOUNTER — Ambulatory Visit (INDEPENDENT_AMBULATORY_CARE_PROVIDER_SITE_OTHER): Payer: Medicare Other | Admitting: Internal Medicine

## 2021-12-23 ENCOUNTER — Encounter: Payer: Self-pay | Admitting: Internal Medicine

## 2021-12-23 ENCOUNTER — Ambulatory Visit (INDEPENDENT_AMBULATORY_CARE_PROVIDER_SITE_OTHER): Payer: Medicare Other

## 2021-12-23 ENCOUNTER — Encounter: Payer: Medicare Other | Admitting: *Deleted

## 2021-12-23 ENCOUNTER — Telehealth: Payer: Self-pay | Admitting: Internal Medicine

## 2021-12-23 VITALS — BP 108/68 | HR 81 | Temp 97.5°F | Resp 26

## 2021-12-23 VITALS — BP 98/62 | HR 73 | Ht 73.0 in | Wt 146.0 lb

## 2021-12-23 VITALS — BP 109/77 | HR 76 | Temp 97.7°F | Resp 26 | Wt 146.2 lb

## 2021-12-23 DIAGNOSIS — R634 Abnormal weight loss: Secondary | ICD-10-CM | POA: Diagnosis not present

## 2021-12-23 DIAGNOSIS — R0609 Other forms of dyspnea: Secondary | ICD-10-CM

## 2021-12-23 DIAGNOSIS — Z006 Encounter for examination for normal comparison and control in clinical research program: Secondary | ICD-10-CM

## 2021-12-23 DIAGNOSIS — J84112 Idiopathic pulmonary fibrosis: Secondary | ICD-10-CM | POA: Diagnosis not present

## 2021-12-23 DIAGNOSIS — R1319 Other dysphagia: Secondary | ICD-10-CM | POA: Diagnosis not present

## 2021-12-23 DIAGNOSIS — J849 Interstitial pulmonary disease, unspecified: Secondary | ICD-10-CM

## 2021-12-23 LAB — CBC WITH DIFFERENTIAL/PLATELET
Basophils Absolute: 0 10*3/uL (ref 0.0–0.1)
Basophils Relative: 0.4 % (ref 0.0–3.0)
Eosinophils Absolute: 0.3 10*3/uL (ref 0.0–0.7)
Eosinophils Relative: 2.2 % (ref 0.0–5.0)
HCT: 42.5 % (ref 39.0–52.0)
Hemoglobin: 14 g/dL (ref 13.0–17.0)
Lymphocytes Relative: 18.8 % (ref 12.0–46.0)
Lymphs Abs: 2.3 10*3/uL (ref 0.7–4.0)
MCHC: 33 g/dL (ref 30.0–36.0)
MCV: 96.1 fl (ref 78.0–100.0)
Monocytes Absolute: 1.2 10*3/uL — ABNORMAL HIGH (ref 0.1–1.0)
Monocytes Relative: 9.7 % (ref 3.0–12.0)
Neutro Abs: 8.5 10*3/uL — ABNORMAL HIGH (ref 1.4–7.7)
Neutrophils Relative %: 68.9 % (ref 43.0–77.0)
Platelets: 284 10*3/uL (ref 150.0–400.0)
RBC: 4.42 Mil/uL (ref 4.22–5.81)
RDW: 13.1 % (ref 11.5–15.5)
WBC: 12.3 10*3/uL — ABNORMAL HIGH (ref 4.0–10.5)

## 2021-12-23 LAB — BASIC METABOLIC PANEL
BUN: 18 mg/dL (ref 6–23)
CO2: 33 mEq/L — ABNORMAL HIGH (ref 19–32)
Calcium: 10 mg/dL (ref 8.4–10.5)
Chloride: 98 mEq/L (ref 96–112)
Creatinine, Ser: 1.04 mg/dL (ref 0.40–1.50)
GFR: 72.27 mL/min (ref >60.00)
Glucose, Bld: 119 mg/dL — ABNORMAL HIGH (ref 70–99)
Potassium: 4 mEq/L (ref 3.5–5.1)
Sodium: 138 mEq/L (ref 135–145)

## 2021-12-23 LAB — HEPATIC FUNCTION PANEL
ALT: 11 U/L (ref 0–53)
AST: 14 U/L (ref 0–37)
Albumin: 3.8 g/dL (ref 3.5–5.2)
Alkaline Phosphatase: 69 U/L (ref 39–117)
Bilirubin, Direct: 0.2 mg/dL (ref 0.0–0.3)
Total Bilirubin: 0.9 mg/dL (ref 0.2–1.2)
Total Protein: 7.6 g/dL (ref 6.0–8.3)

## 2021-12-23 LAB — BRAIN NATRIURETIC PEPTIDE: Pro B Natriuretic peptide (BNP): 47 pg/mL (ref 0.0–100.0)

## 2021-12-23 LAB — TSH: TSH: 3.66 u[IU]/mL (ref 0.35–5.50)

## 2021-12-23 LAB — T4, FREE: Free T4: 1.35 ng/dL (ref 0.60–1.60)

## 2021-12-23 LAB — HEMOGLOBIN A1C: Hgb A1c MFr Bld: 6.1 % (ref 4.6–6.5)

## 2021-12-23 MED ORDER — STUDY - ZEPHYRUS II - PAMREVLUMAB OR PLACEBO 10 MG/ML IV INFUSION (PI-RAMASWAMY)
30.0000 mg/kg | Freq: Once | INTRAVENOUS | Status: AC
  Administered 2021-12-23: 2040 mg via INTRAVENOUS
  Filled 2021-12-23 (×3): qty 204

## 2021-12-23 NOTE — Research (Signed)
Title: FGCL-3019-095 (FibroGen Study) is a Phase 3, randomized, double-blind, placebo-controlled multicenter international study to evaluate the efficacy and safety of 30 mg/kg IV infusions of pamrevlumab administered every 3 weeks for 52 weeks as compared to placebo in subjects with Idiopathic Pulmonary Fibrosis. Primary end point is: change in FVC from baseline at week 52. ZEPHYRUS II STUDY  Protocol #: FGCL-3019-095, Clinical Trials #: [id number] Sponsor: [email or web address]  ([place], [place], [place])  [facility] [employer] Coordinator note :   This visit for Subject Dylan Shea with DOB: 09-06-1950 on 12/23/2021 for the above protocol is Visit/Encounter # Week 15 and is for purpose of research.   The consent for this encounter is under Protocol: Protocol Amendment 2.1 (27Feb23) IB: version 19 [id number] Main ICF: version 03Apr23   Optional Genetic Consent: version 03Apr23  Subject expressed continued interest and consent in continuing as a study subject. Subject confirmed that there was no change in contact information (e.g. address, telephone, email). Subject thanked for participation in research and contribution to science.    The Subject was informed that the PI Dr. Chase Caller continues to have oversight of the subject's visits and course  through relevant discussions, reviews and also specifically of this visit by routing of this note to the [facility].   The patient arrived to the infusion center and is accompanied by his son. The patient continues to experience ongoing weight loss, despite no change in appetite. He reports eating well, but avoids solid foods such as steaks. The patient stated that the weight loss began shortly after fracturing his clavicle in May 2022. Per his primary care doctor's recommendation and concern, he is considering getting a feeding tube. He plans to speak with Dr. Chase Caller for additional insight on this during his scheduled [facility] visit later this  morning. The patient received and tolerated the infusion well today. Please refer to paper source subject binder for further details.     Signed by  Lytle Michaels  Clinical Research Coordinator [facility]  [place], [place] 11:23 AM 12/23/2021

## 2021-12-24 LAB — T3: T3, Total: 154 ng/dL (ref 76–181)

## 2021-12-24 NOTE — Telephone Encounter (Signed)
Thanks for reaching out. We will schedule him for an EGD. He has not been seen by speech and swallow yet (presented severe coughing during esophagram raising concern for pharyngeal dysphagia), we will send a referral again.  Ann, can you please refer the patient to speech and swallow ASAP? Dx: oropharyngeal dysphagia Soledad Gerlach, Can you please schedule an EGD? Dx: dysphagia. Room: 3  Thanks,  Katrinka Blazing, MD Gastroenterology and Hepatology [facility]

## 2021-12-26 ENCOUNTER — Encounter (INDEPENDENT_AMBULATORY_CARE_PROVIDER_SITE_OTHER): Payer: Self-pay

## 2021-12-26 ENCOUNTER — Telehealth: Payer: Self-pay | Admitting: Internal Medicine

## 2021-12-26 ENCOUNTER — Other Ambulatory Visit (INDEPENDENT_AMBULATORY_CARE_PROVIDER_SITE_OTHER): Payer: Self-pay

## 2021-12-26 DIAGNOSIS — R1319 Other dysphagia: Secondary | ICD-10-CM

## 2021-12-26 NOTE — Telephone Encounter (Signed)
Blood work normal  Please let patient or son Jorja Loa know that thyroid, diabetes, kidney and heart blood work are normal and these are not causes of weight loss. At this point, the new GI doc endoscopy results will be helpful in understanding weight loss and results of ECHO.  Pace of weight loss has reduced since starting study  I will see him sept visit  Keep up research infusions  I will figure out the 02/24/22 visit wth Tammy is [facility] v research?  Thanks  MR

## 2021-12-26 NOTE — Telephone Encounter (Signed)
Thanks

## 2021-12-27 ENCOUNTER — Other Ambulatory Visit: Payer: Self-pay | Admitting: Internal Medicine

## 2021-12-27 ENCOUNTER — Telehealth: Payer: Self-pay | Admitting: Internal Medicine

## 2021-12-27 DIAGNOSIS — Z006 Encounter for examination for normal comparison and control in clinical research program: Secondary | ICD-10-CM

## 2021-12-27 DIAGNOSIS — J84112 Idiopathic pulmonary fibrosis: Secondary | ICD-10-CM

## 2021-12-27 NOTE — Telephone Encounter (Signed)
PI OVERSIGHT ATTESTATION  I the Principal Investigator (PI) for the above mentioned study attest that I reviewed the mentioned  clinical research coordinator  notes on research subject  Dylan Shea  born 14-Mar-1951 . I  agree with the findings mentioned above   Dr.Jeriah Skufca Marchelle Gearing, MD Pulmonary and Critical Care Medicine Research Investigator & Staff Physician [facility]  [place] [facility] Pager: [phone], If no answer or between  15:00h - 7:00h: call [phone]  12/27/2021 3:05 PM

## 2021-12-27 NOTE — Telephone Encounter (Signed)
I called and spoke with Dewayne Shorter, Mr. Hallisey's wife, on 27-Dec-2021 and informed her that the FGCL-3019-095 trial will be discontinued by the sponsor. It was shared that the Zephyrus-2 trial was designed as a confirmatory trial to support positive results in Zephyrus-1. As the Zephyrus-1 study did not demonstrate a benefit in IPF patients, the sponsor has chosen to close the [employer]. However, pamrevlumab was found to be generally safe and well tolerated. Mrs. Early verbally acknowledged this announcement and stated that Mr. Danesh agrees to proceed with the early termination visit and any final study activities.

## 2021-12-28 ENCOUNTER — Telehealth (INDEPENDENT_AMBULATORY_CARE_PROVIDER_SITE_OTHER): Payer: Self-pay

## 2021-12-28 NOTE — Telephone Encounter (Signed)
Please inform the patient. Thanks

## 2021-12-28 NOTE — Telephone Encounter (Signed)
Per Dr Sherrie Sport Mr Purk [birth year] is able to hold his Plavix for 5 days prior to procedure.

## 2021-12-29 ENCOUNTER — Ambulatory Visit (INDEPENDENT_AMBULATORY_CARE_PROVIDER_SITE_OTHER)
Admission: RE | Admit: 2021-12-29 | Discharge: 2021-12-29 | Disposition: A | Discharge status: Home / Self Care | Payer: Self-pay | Source: Ambulatory Visit | Attending: Internal Medicine | Admitting: Internal Medicine

## 2021-12-29 DIAGNOSIS — Z006 Encounter for examination for normal comparison and control in clinical research program: Secondary | ICD-10-CM

## 2021-12-29 DIAGNOSIS — J84112 Idiopathic pulmonary fibrosis: Secondary | ICD-10-CM

## 2021-12-30 NOTE — Patient Instructions (Addendum)
20    Your procedure is scheduled on: 01/05/2022  Report to [facility] at    7:15 AM.  Call this number if you have problems the morning of surgery: [phone]   Remember:   Follow instructions on letter from office regarding when to stop eating and drinking        No Smoking the day of procedure      Take these medicines the morning of surgery with A SIP OF WATER: levothyroxine and oxycodone if needed  Use inhalers if needed  No diabetic medications am of procedure  Hold Plavix for 5 days as instructed in letter   Do not wear jewelry, make-up or nail polish.  Do not wear lotions, powders, or perfumes. You may wear deodorant.                Do not bring valuables to the hospital.  Contacts, dentures or bridgework may not be worn into surgery.  Leave suitcase in the car. After surgery it may be brought to your room.  For patients admitted to the hospital, checkout time is 11:00 AM the day of discharge.   Patients discharged the day of surgery will not be allowed to drive home. Upper Endoscopy, Adult Upper endoscopy is a procedure to look inside the upper GI (gastrointestinal) tract. The upper GI tract is made up of: The part of the body that moves food from your mouth to your stomach (esophagus). The stomach. The first part of your small intestine (duodenum). This procedure is also called esophagogastroduodenoscopy (EGD) or gastroscopy. In this procedure, your health care provider passes a thin, flexible tube (endoscope) through your mouth and down your esophagus into your stomach. A small camera is attached to the end of the tube. Images from the camera appear on a monitor in the exam room. During this procedure, your health care provider may also remove a small piece of tissue to be sent to a lab and examined under a microscope (biopsy). Your health care provider may do an upper endoscopy to diagnose cancers of the upper GI tract. You may also have this procedure to find  the cause of other conditions, such as: Stomach pain. Heartburn. Pain or problems when swallowing. Nausea and vomiting. Stomach bleeding. Stomach ulcers. Tell a health care provider about: Any allergies you have. All medicines you are taking, including vitamins, herbs, eye drops, creams, and over-the-counter medicines. Any problems you or family members have had with anesthetic medicines. Any blood disorders you have. Any surgeries you have had. Any medical conditions you have. Whether you are pregnant or may be pregnant. What are the risks? Generally, this is a safe procedure. However, problems may occur, including: Infection. Bleeding. Allergic reactions to medicines. A tear or hole (perforation) in the esophagus, stomach, or duodenum. What happens before the procedure? Staying hydrated Follow instructions from your health care provider about hydration, which may include: Up to 4 hours before the procedure - you may continue to drink clear liquids, such as water, clear fruit juice, black coffee, and plain tea.   Medicines Ask your health care provider about: Changing or stopping your regular medicines. This is especially important if you are taking diabetes medicines or blood thinners. Taking medicines such as aspirin and ibuprofen. These medicines can thin your blood. Do not take these medicines unless your health care provider tells you to take them. Taking over-the-counter medicines, vitamins, herbs, and supplements. General instructions Plan to have someone take you home from the  hospital or clinic. If you will be going home right after the procedure, plan to have someone with you for 24 hours. Ask your health care provider what steps will be taken to help prevent infection. What happens during the procedure?  An IV will be inserted into one of your veins. You may be given one or more of the following: A medicine to help you relax (sedative). A medicine to numb the  throat (local anesthetic). You will lie on your left side on an exam table. Your health care provider will pass the endoscope through your mouth and down your esophagus. Your health care provider will use the scope to check the inside of your esophagus, stomach, and duodenum. Biopsies may be taken. The endoscope will be removed. The procedure may vary among health care providers and hospitals. What happens after the procedure? Your blood pressure, heart rate, breathing rate, and blood oxygen level will be monitored until you leave the hospital or clinic. Do not drive for 24 hours if you were given a sedative during your procedure. When your throat is no longer numb, you may be given some fluids to drink. It is up to you to get the results of your procedure. Ask your health care provider, or the department that is doing the procedure, when your results will be ready. Summary Upper endoscopy is a procedure to look inside the upper GI tract. During the procedure, an IV will be inserted into one of your veins. You may be given a medicine to help you relax. A medicine will be used to numb your throat. The endoscope will be passed through your mouth and down your esophagus. This information is not intended to replace advice given to you by your health care provider. Make sure you discuss any questions you have with your health care provider. Document Revised: 12/12/2017 Document Reviewed: 11/19/2017 Elsevier Patient Education  [place] After  Please read the instructions outlined below and refer to this sheet in the next few weeks. These discharge instructions provide you with general information on caring for yourself after you leave the hospital. Your doctor may also give you specific instructions. While your treatment has been planned  according to the most current medical practices available, unavoidable complications occasionally occur. If you have any problems or questions after discharge, please call your doctor. HOME CARE INSTRUCTIONS Activity You may resume your regular activity but move at a slower pace for the next 24 hours.  Take frequent rest periods for the next 24 hours.  Walking will help expel (get rid of) the air and reduce the bloated feeling in your abdomen.  No driving for 24 hours (because of the anesthesia (medicine) used during the test).  You may shower.  Do not sign any important legal documents or operate any machinery for 24 hours (because of the anesthesia used during the test).  Nutrition Drink plenty of fluids.  You may resume your normal diet.  Begin with a light meal and progress to your normal diet.  Avoid alcoholic beverages for 24 hours or as instructed by your caregiver.  Medications You may resume your normal medications unless your caregiver tells you otherwise. What you can expect today You may experience abdominal discomfort such as a feeling of fullness or "gas" pains.  You may experience a sore throat for 2 to 3 days. This is normal. Gargling with salt water may help this.  Follow-up Your doctor will discuss the results of your test with you. SEEK IMMEDIATE MEDICAL CARE IF: You have excessive nausea (feeling sick to your stomach) and/or vomiting.  You have severe abdominal pain and distention (swelling).  You have trouble swallowing.  You have a temperature over 100 F (37.8 C).  You have rectal bleeding or vomiting of blood.  Document Released: 02/01/2004 Document Revised: 06/08/2011 Document Reviewed: 08/14/2007

## 2022-01-02 ENCOUNTER — Telehealth: Payer: Self-pay | Admitting: Internal Medicine

## 2022-01-02 NOTE — Telephone Encounter (Signed)
Called and spoke with patient. He verbalized understanding of recommendations. Nothing further needed at time of call.

## 2022-01-02 NOTE — Telephone Encounter (Signed)
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 No change in recommendation. He is having EGD by the new GI doc and he should go thrugh that. He should avoid straining  during toilet or lifting heay objects

## 2022-01-02 NOTE — Telephone Encounter (Signed)
IMPRESSION: 1. Subpleural predominant fibrotic interstitial lung disease with slight lower lung predominance, traction bronchiectasis, and honeycomb change. No evidence of progression. Findings are consistent with UIP per consensus guidelines: Diagnosis of Idiopathic Pulmonary Fibrosis: An Official ATS/ERS/JRS/ALAT Clinical Practice Guideline. [place], Iss 5, [phone], Mar 03 2017. 2. Areas of trace pneumomediastinum with no associated mediastinal fluid, likely due to [facility] effect. Similar finding was also seen on more remote prior chest CT dated December 04, 2020. 3. Coronary artery calcifications and aortic Atherosclerosis (ICD10-I70.0).   These results will be called to the ordering clinician or representative by the Radiologist Assistant, and communication documented in the PACS or [employer].     Electronically Signed   By: Yetta Glassman M.D.   On: 12/30/2021 18:00

## 2022-01-04 ENCOUNTER — Encounter (HOSPITAL_COMMUNITY)
Admission: RE | Admit: 2022-01-04 | Discharge: 2022-01-04 | Disposition: A | Discharge status: Home / Self Care | Payer: Medicare Other | Source: Ambulatory Visit | Attending: Gastroenterology | Admitting: Gastroenterology

## 2022-01-04 ENCOUNTER — Encounter (HOSPITAL_COMMUNITY): Payer: Self-pay

## 2022-01-04 VITALS — BP 79/62 | HR 98 | Temp 97.8°F | Ht 73.0 in | Wt 155.0 lb

## 2022-01-04 DIAGNOSIS — Z01810 Encounter for preprocedural cardiovascular examination: Secondary | ICD-10-CM | POA: Diagnosis not present

## 2022-01-04 DIAGNOSIS — I10 Essential (primary) hypertension: Secondary | ICD-10-CM

## 2022-01-04 HISTORY — DX: Dyspnea, unspecified: R06.00

## 2022-01-05 ENCOUNTER — Encounter (HOSPITAL_COMMUNITY): Payer: Self-pay | Admitting: Gastroenterology

## 2022-01-05 ENCOUNTER — Ambulatory Visit (HOSPITAL_BASED_OUTPATIENT_CLINIC_OR_DEPARTMENT_OTHER): Payer: Medicare Other | Admitting: Anesthesiology

## 2022-01-05 ENCOUNTER — Ambulatory Visit (HOSPITAL_COMMUNITY)
Admission: RE | Admit: 2022-01-05 | Discharge: 2022-01-05 | Disposition: A | Discharge status: Home / Self Care | Payer: Medicare Other | Attending: Gastroenterology | Admitting: Gastroenterology

## 2022-01-05 ENCOUNTER — Encounter (INDEPENDENT_AMBULATORY_CARE_PROVIDER_SITE_OTHER): Payer: Self-pay

## 2022-01-05 ENCOUNTER — Telehealth (INDEPENDENT_AMBULATORY_CARE_PROVIDER_SITE_OTHER): Payer: Self-pay

## 2022-01-05 ENCOUNTER — Other Ambulatory Visit (INDEPENDENT_AMBULATORY_CARE_PROVIDER_SITE_OTHER): Payer: Self-pay

## 2022-01-05 ENCOUNTER — Encounter (HOSPITAL_COMMUNITY)
Admission: RE | Disposition: A | Discharge status: Home / Self Care | Payer: Self-pay | Source: Home / Self Care | Attending: Gastroenterology

## 2022-01-05 ENCOUNTER — Ambulatory Visit (HOSPITAL_COMMUNITY): Payer: Medicare Other | Admitting: Anesthesiology

## 2022-01-05 DIAGNOSIS — K208 Other esophagitis without bleeding: Secondary | ICD-10-CM | POA: Diagnosis not present

## 2022-01-05 DIAGNOSIS — I152 Hypertension secondary to endocrine disorders: Secondary | ICD-10-CM

## 2022-01-05 DIAGNOSIS — K219 Gastro-esophageal reflux disease without esophagitis: Secondary | ICD-10-CM | POA: Diagnosis not present

## 2022-01-05 DIAGNOSIS — E7800 Pure hypercholesterolemia, unspecified: Secondary | ICD-10-CM | POA: Insufficient documentation

## 2022-01-05 DIAGNOSIS — K449 Diaphragmatic hernia without obstruction or gangrene: Secondary | ICD-10-CM | POA: Diagnosis not present

## 2022-01-05 DIAGNOSIS — R1310 Dysphagia, unspecified: Secondary | ICD-10-CM

## 2022-01-05 DIAGNOSIS — E119 Type 2 diabetes mellitus without complications: Secondary | ICD-10-CM | POA: Insufficient documentation

## 2022-01-05 DIAGNOSIS — Z7984 Long term (current) use of oral hypoglycemic drugs: Secondary | ICD-10-CM

## 2022-01-05 DIAGNOSIS — Z006 Encounter for examination for normal comparison and control in clinical research program: Secondary | ICD-10-CM

## 2022-01-05 DIAGNOSIS — R1319 Other dysphagia: Secondary | ICD-10-CM

## 2022-01-05 DIAGNOSIS — J8410 Pulmonary fibrosis, unspecified: Secondary | ICD-10-CM | POA: Diagnosis not present

## 2022-01-05 DIAGNOSIS — J849 Interstitial pulmonary disease, unspecified: Secondary | ICD-10-CM

## 2022-01-05 DIAGNOSIS — R634 Abnormal weight loss: Secondary | ICD-10-CM | POA: Diagnosis not present

## 2022-01-05 DIAGNOSIS — R1312 Dysphagia, oropharyngeal phase: Secondary | ICD-10-CM | POA: Diagnosis not present

## 2022-01-05 DIAGNOSIS — Z794 Long term (current) use of insulin: Secondary | ICD-10-CM | POA: Insufficient documentation

## 2022-01-05 DIAGNOSIS — I10 Essential (primary) hypertension: Secondary | ICD-10-CM

## 2022-01-05 DIAGNOSIS — J449 Chronic obstructive pulmonary disease, unspecified: Secondary | ICD-10-CM | POA: Diagnosis not present

## 2022-01-05 DIAGNOSIS — Z86010 Personal history of colonic polyps: Secondary | ICD-10-CM

## 2022-01-05 HISTORY — PX: ESOPHAGOGASTRODUODENOSCOPY (EGD) WITH PROPOFOL: SHX5813

## 2022-01-05 HISTORY — PX: BIOPSY: SHX5522

## 2022-01-05 HISTORY — PX: ESOPHAGEAL DILATION: SHX303

## 2022-01-05 LAB — GLUCOSE, CAPILLARY: Glucose-Capillary: 124 mg/dL — ABNORMAL HIGH (ref 70–99)

## 2022-01-05 SURGERY — ESOPHAGOGASTRODUODENOSCOPY (EGD) WITH PROPOFOL
Anesthesia: General

## 2022-01-05 MED ORDER — PROPOFOL 10 MG/ML IV BOLUS
INTRAVENOUS | Status: DC | PRN
  Administered 2022-01-05: 30 mg via INTRAVENOUS
  Administered 2022-01-05 (×2): 20 mg via INTRAVENOUS
  Administered 2022-01-05: 60 mg via INTRAVENOUS

## 2022-01-05 MED ORDER — LIDOCAINE 2% (20 MG/ML) 5 ML SYRINGE
INTRAMUSCULAR | Status: DC | PRN
  Administered 2022-01-05: 50 mg via INTRAVENOUS

## 2022-01-05 MED ORDER — LACTATED RINGERS IV SOLN
INTRAVENOUS | Status: DC
  Administered 2022-01-05: 1000 mL via INTRAVENOUS

## 2022-01-05 MED ORDER — PEG 3350-KCL-NA BICARB-NACL 420 G PO SOLR: 4000.0000 mL | ORAL | 0 refills | Status: DC

## 2022-01-05 NOTE — Telephone Encounter (Signed)
Dylan Shea is scheduled on Friday 02/17/22 at 9:00 I have mailed him instructions

## 2022-01-05 NOTE — Telephone Encounter (Signed)
Hi Dr. Chase Caller, We performed the EGD today and there were no major abnormalities explain his weight loss.  Both the patient and his wife reported that he was having an adequate oral intake and in fact he was taking protein shakes on top of his regular meals.  It seems that his weight loss is unintentional.  I will schedule him for a colonoscopy to evaluate this further, but other etiologies for unintentional weight loss will need to be evaluated by Dr. Sherrie Sport.  I do not consider that PEG tube will provide any benefit as his oral intake is adequate.  She was also referred to speech and swallow to further evaluate his oropharyngeal dysphagia and aspiration episodes.  Please let me know if you have any questions.  Thanks,  Maylon Peppers, MD Gastroenterology and Hepatology [facility]

## 2022-01-05 NOTE — Op Note (Signed)
[facility] Patient Name: Dylan Shea Procedure Date: 01/05/2022 8:50 AM MRN: [id number] Date of Birth: [birth year] Attending MD: Maylon Peppers ,  CSN: [id number] Age: 71 Admit Type: Outpatient Procedure:                Upper GI endoscopy Indications:              Dysphagia, Weight loss Providers:                Maylon Peppers, Caprice Kluver, Charlsie Quest. Theda Sers RN,                            RN Referring MD:             Brand Males, MD Medicines:                Monitored Anesthesia Care Complications:            No immediate complications. Estimated Blood Loss:     Estimated blood loss: none. Procedure:                Pre-Anesthesia Assessment:                           - Prior to the procedure, a History and Physical                            was performed, and patient medications, allergies                            and sensitivities were reviewed. The patient's                            tolerance of previous anesthesia was reviewed.                           - The risks and benefits of the procedure and the                            sedation options and risks were discussed with the                            patient. All questions were answered and informed                            consent was obtained.                           - ASA Grade Assessment: III - A patient with severe                            systemic disease.                           After obtaining informed consent, the endoscope was                            passed under direct vision. Throughout the  procedure, the patient's blood pressure, pulse, and                            oxygen saturations were monitored continuously. The                            [id number]-H190 ([id number]) scope was introduced through the                            mouth, and advanced to the second part of duodenum.                            The upper GI endoscopy was accomplished without                             difficulty. The patient tolerated the procedure                            well. Scope In: 9:04:29 AM Scope Out: 9:12:45 AM Total Procedure Duration: 0 hours 8 minutes 16 seconds  Findings:      No endoscopic abnormality was evident in the esophagus to explain the       patient's complaint of dysphagia. It was decided, however, to proceed       with dilation of the entire esophagus. A guidewire was placed and the       scope was withdrawn. Dilation was performed with a Savary dilator with       no resistance at 18 mm. Mild mucosal disruption was seen upon       reinspection in the upper esophageal tract.      A 2 cm hiatal hernia was present. One of the rims appeared to be       slightly nodular in the proximal area of the hernia. Biopsies were taken       from with a cold forceps for histology.      The gastroesophageal flap valve was visualized endoscopically and       classified as Hill Grade III (minimal fold, loose to endoscope, hiatal       hernia likely).      The stomach was normal.      The examined duodenum was normal. Impression:               - No endoscopic esophageal abnormality to explain                            patient's dysphagia. Esophagus dilated. Dilated.                           - 2 cm hiatal hernia. Biopsied.                           - Gastroesophageal flap valve classified as Hill                            Grade III (minimal fold, loose to endoscope, hiatal  hernia likely).                           - Normal stomach.                           - Normal examined duodenum. Moderate Sedation:      Per Anesthesia Care Recommendation:           - Discharge patient to home (ambulatory).                           - Advance diet as tolerated.                           - Proceed with speech and swallow evaluation                           - Await pathology results.                           - Schedule colonoscopy for  evaluation of weight loss                           - Discuss with PCP other etiologies of weight loss                            as it seems the oral intake has not changed                            recently.                           - Restart Plavix today. Procedure Code(s):        --- Professional ---                           [phone], Esophagogastroduodenoscopy, flexible,                            transoral; with insertion of guide wire followed by                            passage of dilator(s) through esophagus over guide                            wire                           [id number], 59, Esophagogastroduodenoscopy, flexible,                            transoral; with biopsy, single or multiple Diagnosis Code(s):        --- Professional ---                           R13.10, Dysphagia, unspecified  K44.9, Diaphragmatic hernia without obstruction or                            gangrene                           R63.4, Abnormal weight loss CPT copyright 2019 American Medical Association. All rights reserved. The codes documented in this report are preliminary and upon coder review may  be revised to meet current compliance requirements. Maylon Peppers, MD Maylon Peppers,  01/05/2022 9:24:04 AM This report has been signed electronically. Number of Addenda: 0

## 2022-01-05 NOTE — Telephone Encounter (Signed)
-----   Message from [name], MD sent at 01/05/2022  8:26 AM EDT ----- Salley Scarlet Darius Bump,   Can you please schedule a colonoscopy? Dx: weight loss. Room: 3  Thanks,  Maylon Peppers, MD Gastroenterology and Hepatology [facility]

## 2022-01-05 NOTE — Telephone Encounter (Signed)
Dylan Shea Dylan Shea Dylan Shea, CMA  ?

## 2022-01-05 NOTE — Anesthesia Postprocedure Evaluation (Signed)
Anesthesia Post Note  Patient: Dylan Shea  Procedure(s) Performed: ESOPHAGOGASTRODUODENOSCOPY (EGD) WITH PROPOFOL ESOPHAGEAL DILATION BIOPSY  Patient location during evaluation: Phase II Anesthesia Type: General Level of consciousness: awake and alert and oriented Pain management: pain level controlled Vital Signs Assessment: post-procedure vital signs reviewed and stable Respiratory status: spontaneous breathing, nonlabored ventilation and respiratory function stable Cardiovascular status: blood pressure returned to baseline and stable Postop Assessment: no apparent nausea or vomiting Anesthetic complications: no   No notable events documented.   Last Vitals:  Vitals:   01/05/22 0919 01/05/22 0920  BP:  (!) 106/52  Pulse: 73   Resp: 19   Temp: (!) 36.1 C   SpO2: 96%     Last Pain:  Vitals:   01/05/22 0919  TempSrc: Axillary  PainSc: 0-No pain                 [name]

## 2022-01-05 NOTE — Anesthesia Preprocedure Evaluation (Addendum)
Anesthesia Evaluation  Patient identified by MRN, date of birth, ID band Patient awake    Reviewed: Allergy & Precautions, NPO status , Patient's Chart, lab work & pertinent test results  Airway Mallampati: II  TM Distance: >3 FB Neck ROM: Full    Dental  (+) Dental Advisory Given, Missing, Poor Dentition, Chipped   Pulmonary shortness of breath and with exertion, COPD (pulmonary fibrosis),    Pulmonary exam normal breath sounds clear to auscultation       Cardiovascular hypertension, Pt. on medications Normal cardiovascular exam Rhythm:Regular Rate:Normal     Neuro/Psych negative neurological ROS  negative psych ROS   GI/Hepatic Neg liver ROS, GERD  Poorly Controlled,  Endo/Other  diabetes, Well Controlled, Type 2, Oral Hypoglycemic Agents, Insulin Dependent  Renal/GU negative Renal ROS  negative genitourinary   Musculoskeletal negative musculoskeletal ROS (+)   Abdominal   Peds negative pediatric ROS (+)  Hematology negative hematology ROS (+)   Anesthesia Other Findings   Reproductive/Obstetrics negative OB ROS                            Anesthesia Physical Anesthesia Plan  ASA: 3  Anesthesia Plan: General   Post-op Pain Management: Minimal or no pain anticipated   Induction: Intravenous  PONV Risk Score and Plan: Propofol infusion  Airway Management Planned: Nasal Cannula and Natural Airway  Additional Equipment:   Intra-op Plan:   Post-operative Plan: Possible Post-op intubation/ventilation  Informed Consent: I have reviewed the patients History and Physical, chart, labs and discussed the procedure including the risks, benefits and alternatives for the proposed anesthesia with the patient or authorized representative who has indicated his/her understanding and acceptance.     Dental advisory given  Plan Discussed with: CRNA and Surgeon  Anesthesia Plan Comments:  (Risk of aspiration pneumonia and postop ventilation was explained,Possible GA with ETT was explained.)        Anesthesia Quick Evaluation

## 2022-01-05 NOTE — Telephone Encounter (Signed)
Thanks

## 2022-01-05 NOTE — Discharge Instructions (Addendum)
You are being discharged to home.  Advance your diet as tolerated.  We are waiting for your pathology results.  Proceed with speech and swallow evaluation Schedule colonoscopy for evaluation of weight loss Discuss with PCP other etiologies of weight loss as it seems the oral intake has not changed recently. Restart Plavix tonight

## 2022-01-05 NOTE — Transfer of Care (Signed)
Immediate Anesthesia Transfer of Care Note  Patient: Dylan Shea  Procedure(s) Performed: ESOPHAGOGASTRODUODENOSCOPY (EGD) WITH PROPOFOL ESOPHAGEAL DILATION BIOPSY  Patient Location: Short Stay  Anesthesia Type:MAC  Level of Consciousness: awake and drowsy  Airway & Oxygen Therapy: Patient Spontanous Breathing  Post-op Assessment: Report given to RN and Post -op Vital signs reviewed and stable  Post vital signs: Reviewed and stable  Last Vitals:  Vitals Value Taken Time  BP 106/52 01/05/22 0920  Temp 36.1 C 01/05/22 0919  Pulse 73 01/05/22 0919  Resp 19 01/05/22 0919  SpO2 96 % 01/05/22 0919    Last Pain:  Vitals:   01/05/22 0919  TempSrc: Axillary  PainSc: 0-No pain      Patients Stated Pain Goal: 5 (18/84/16 6063)  Complications: No notable events documented.

## 2022-01-05 NOTE — H&P (Signed)
Dylan Shea is an 71 y.o. male.   Chief Complaint: Weight loss and oropharyngeal dysphagia HPI: 71 year old male with past medical history of diabetes, GERD, hyperlipidemia, hypertension, IPF and diabetes, who comes for evaluation of weight loss and oropharyngeal dysphagia.  Patient has presented progressive weight loss that has been unintentional even though he reported he has been eating as usual and he is having an adequate oral intake.  He reports having some choking episodes in the past which has been evaluated with an esophagram that showed laryngeal aspiration.  Patient evaluated by pulmonology who was concerned about weight loss and requested evaluation by our team.  Past Medical History:  Diagnosis Date   Diabetes ([place]) 01/10/2017   Dyspnea    GERD (gastroesophageal reflux disease)    Hypercholesteremia    Hypertension    Pulmonary fibrosis ([place]) 07/2021    Past Surgical History:  Procedure Laterality Date   COLONOSCOPY     ESOPHAGEAL DILATION N/A 04/26/2017   Procedure: ESOPHAGEAL DILATION;  Surgeon: Dylan Houston, MD;  Location: [facility];  Service: Endoscopy;  Laterality: N/A;   ESOPHAGOGASTRODUODENOSCOPY N/A 04/26/2017   Procedure: ESOPHAGOGASTRODUODENOSCOPY (EGD);  Surgeon: Dylan Houston, MD;  Location: [facility];  Service: Endoscopy;  Laterality: N/A;  12:45    Family History  Problem Relation Age of Onset   Emphysema Mother    Social History:  reports that he has never smoked. He has never used smokeless tobacco. He reports that he does not drink alcohol and does not use drugs.  Allergies: No Known Allergies  Medications Prior to Admission  Medication Sig Dispense Refill   albuterol (VENTOLIN HFA) 108 (90 Base) MCG/ACT inhaler Inhale 1-2 puffs into the lungs every 6 (six) hours as needed. 8 g 2   atorvastatin (LIPITOR) 10 MG tablet Take 10 mg by mouth daily.     clopidogrel (PLAVIX) 75 MG tablet Take 75 mg by mouth daily.     insulin glargine  (LANTUS) 100 UNIT/ML injection Inject into the skin.     levothyroxine (SYNTHROID) 50 MCG tablet Take 50 mcg by mouth daily before breakfast.     losartan (COZAAR) 100 MG tablet Take 100 mg by mouth daily.     oxyCODONE-acetaminophen (PERCOCET/ROXICET) 5-325 MG tablet Take 1 tablet by mouth 3 (three) times daily as needed for pain.     STUDY - ZEPHYRUS II - pamrevlumab or placebo 10 mg/mL IV infusion (PI-Dylan Shea) Inject 30 mg/kg into the vein once. For investigational use only - Administration rate per protocol. For IV administration only in a dedicated line with a 0.2 micron in-line filer.Given every 3 weeks in clinic.      Results for orders placed or performed during the hospital encounter of 01/05/22 (from the past 48 hour(s))  Glucose, capillary     Status: Abnormal   Collection Time: 01/05/22  8:14 AM  Result Value Ref Range   Glucose-Capillary 124 (H) 70 - 99 mg/dL    Comment: Glucose reference range applies only to samples taken after fasting for at least 8 hours.   No results found.  Review of Systems  Blood pressure (!) 161/84, temperature (!) 97.4 F (36.3 C), temperature source Oral, resp. rate (!) 21, SpO2 98 %. Physical Exam  GENERAL: The patient is AO x3, in no acute distress. HEENT: Head is normocephalic and atraumatic. EOMI are intact. Mouth is well hydrated and without lesions. NECK: Supple. No masses LUNGS: Clear to auscultation. No presence of rhonchi/wheezing/rales. Adequate chest expansion HEART: RRR,  normal s1 and s2. ABDOMEN: Soft, nontender, no guarding, no peritoneal signs, and nondistended. BS +. No masses. EXTREMITIES: Without any cyanosis, clubbing, rash, lesions or edema. NEUROLOGIC: AOx3, no focal motor deficit. SKIN: no jaundice, no rashes  Assessment/Plan  71 year old male with past medical history of diabetes, GERD, hyperlipidemia, hypertension, IPF and diabetes, who comes for evaluation of weight loss and oropharyngeal dysphagia.  We will proceed  with EGD with possible dilation.  Dylan Quale, MD 01/05/2022, 8:41 AM

## 2022-01-06 ENCOUNTER — Encounter: Payer: Medicare Other | Admitting: *Deleted

## 2022-01-06 ENCOUNTER — Encounter (INDEPENDENT_AMBULATORY_CARE_PROVIDER_SITE_OTHER): Payer: Medicare Other | Admitting: Internal Medicine

## 2022-01-06 VITALS — BP 120/70 | HR 75 | Temp 97.7°F | Resp 22 | Wt 148.6 lb

## 2022-01-06 DIAGNOSIS — Z006 Encounter for examination for normal comparison and control in clinical research program: Secondary | ICD-10-CM

## 2022-01-06 DIAGNOSIS — J84112 Idiopathic pulmonary fibrosis: Secondary | ICD-10-CM

## 2022-01-06 LAB — SURGICAL PATHOLOGY

## 2022-01-06 NOTE — Patient Instructions (Signed)
Keep soc appt in sept 2023

## 2022-01-06 NOTE — Research (Signed)
Title: FGCL-3019-095 (FibroGen Study) is a Phase 3, randomized, double-blind, placebo-controlled multicenter international study to evaluate the efficacy and safety of 30 mg/kg IV infusions of pamrevlumab administered every 3 weeks for 52 weeks as compared to placebo in subjects with Idiopathic Pulmonary Fibrosis. Primary end point is: change in FVC from baseline at week 52. ZEPHYRUS II STUDY  Protocol #: FGCL-3019-095, Clinical Trials #: [id number] Sponsor: [email or web address]  ([place], [place], [place])  [facility] [employer] Coordinator note :   This visit for Subject Dylan Shea with DOB: 16-Jun-1951 on 01/06/2022 for the above protocol is Visit/Encounter # Early Termination and is for purpose of research.   The consent for this encounter is under Protocol: Protocol Amendment 2.1 (27Feb23) IB: version 20 ([id number]) Main ICF: version 03Apr23   Optional Genetic Consent: version 03Apr23 OLE ICF: version 03Apr23  Subject expressed continued interest and consent in continuing as a study subject. Subject confirmed that there was no change in contact information (e.g. address, telephone, email). Subject thanked for participation in research and contribution to science.  In this visit 01/06/2022 the subject will be evaluated by Principal Investigator named Dr. Brand Males. This research coordinator has verified that the above investigator is up to date with his training logs.   The Subject was informed that the PI Dr. Chase Caller continues to have oversight of the subject's visits and course  through relevant discussions, reviews and also specifically of this visit by routing of this note to the [place].  The patient appeared well today. He had no new complaints since his last visit. Given that this was an Early Termination visit, no IP infusion was given. PI Dr. Chase Caller discussed the option of considering standard anti-fibrotics, and other research trials after his scheduled colonoscopy  in August due to ongoing weight loss. Please refer to paper source subject binder for further details.    Signed by  [place] Coordinator [facility]  [place], [place] 1:27 PM 01/06/2022

## 2022-01-06 NOTE — Progress Notes (Signed)
Title: FGCL-3019-095 (FibroGen Study) is a Phase 3, randomized, double-blind, placebo-controlled multicenter international study to evaluate the efficacy and safety of 30 mg/kg IV infusions of pamrevlumab administered every 3 weeks for 52 weeks as compared to placebo in subjects with Idiopathic Pulmonary Fibrosis. Primary end point is: change in FVC from baseline at week 52. ZEPHYRUS II STUDY  Protocol #: FGCL-3019-095, Clinical Trials #: [id number] Sponsor: [email or web address]  [place], [place], [place])  S: This is the end of study termination visit.  The protocol is being closed by the sponsor because of lack of efficacy.  No safety concerns.  According to the news release there was a marginal positive benefit but was not significant enough to warrant an FDA approval.  Therefore the study has been closed.  This was in the cystoscopy study 091 study..  We explained this to the patient and also the son.  He is understanding.  Physical exam was done.  From a standard of care perspective he is undergoing upper endoscopy which was very reassuring..  A colonoscopy is pending in August.  His weight loss might be stabilizing.   O Physical exam done in the source document paper  A/P  = Re research subject -IPF  Plan  -, Study.  We will monitor your weight loss and your endoscopy and colonoscopy work-up.  At follow-up visit we will discuss about starting antifibrotic standard of care if the weight loss issues have somewhat stabilized.  Other consideration is to consider clinical trial that does not have significant GI side effects or weight loss issues.     SIGNATURE    Dr. Brand Males, M.D., F.C.C.P, ACRP-CPI Pulmonary and Critical Care Medicine Research Investigator, [facility] @ [place] Staff Physician, [place] Director - Interstitial Lung Disease  Program  [facility] and [facility] @ [place],  [place], [address]   Pager: [phone], If no answer  OR between  19:00-7:00h: page [phone] Telephone (research): [phone]  2:47 PM 01/06/2022   2:47 PM 01/06/2022

## 2022-01-09 ENCOUNTER — Encounter (INDEPENDENT_AMBULATORY_CARE_PROVIDER_SITE_OTHER): Payer: Self-pay

## 2022-01-09 DIAGNOSIS — E7849 Other hyperlipidemia: Secondary | ICD-10-CM | POA: Diagnosis not present

## 2022-01-09 DIAGNOSIS — E46 Unspecified protein-calorie malnutrition: Secondary | ICD-10-CM | POA: Diagnosis not present

## 2022-01-09 DIAGNOSIS — M5459 Other low back pain: Secondary | ICD-10-CM | POA: Diagnosis not present

## 2022-01-09 DIAGNOSIS — I700 Atherosclerosis of aorta: Secondary | ICD-10-CM | POA: Diagnosis not present

## 2022-01-09 DIAGNOSIS — E038 Other specified hypothyroidism: Secondary | ICD-10-CM | POA: Diagnosis not present

## 2022-01-09 DIAGNOSIS — J849 Interstitial pulmonary disease, unspecified: Secondary | ICD-10-CM | POA: Diagnosis not present

## 2022-01-09 DIAGNOSIS — M818 Other osteoporosis without current pathological fracture: Secondary | ICD-10-CM | POA: Diagnosis not present

## 2022-01-09 DIAGNOSIS — I10 Essential (primary) hypertension: Secondary | ICD-10-CM | POA: Diagnosis not present

## 2022-01-09 DIAGNOSIS — E1169 Type 2 diabetes mellitus with other specified complication: Secondary | ICD-10-CM | POA: Diagnosis not present

## 2022-01-09 DIAGNOSIS — Z681 Body mass index (BMI) 19 or less, adult: Secondary | ICD-10-CM | POA: Diagnosis not present

## 2022-01-11 ENCOUNTER — Encounter (HOSPITAL_COMMUNITY): Payer: Self-pay | Admitting: Gastroenterology

## 2022-01-12 ENCOUNTER — Encounter (INDEPENDENT_AMBULATORY_CARE_PROVIDER_SITE_OTHER): Payer: Self-pay

## 2022-01-12 ENCOUNTER — Telehealth: Payer: Self-pay | Admitting: Internal Medicine

## 2022-01-12 ENCOUNTER — Telehealth (INDEPENDENT_AMBULATORY_CARE_PROVIDER_SITE_OTHER): Payer: Self-pay

## 2022-01-12 DIAGNOSIS — Z006 Encounter for examination for normal comparison and control in clinical research program: Secondary | ICD-10-CM

## 2022-01-12 DIAGNOSIS — J84112 Idiopathic pulmonary fibrosis: Secondary | ICD-10-CM

## 2022-01-12 NOTE — Telephone Encounter (Signed)
Per Dr Verlon Setting Horn[place] 07/11/60 may hold his Plavix for 5 days prior to procedure.

## 2022-01-12 NOTE — Telephone Encounter (Signed)
Please inform the patient about these recs Thanks

## 2022-01-12 NOTE — Telephone Encounter (Signed)
I spoke with Rudi Heap on 01/12/2022 at 10:50 a.m. I mentioned that PI Dr. Chase Caller was made aware of the missed consent process during Issac's visit conducted on 06-Jan-2022. I explained that a new revised ICF was released by the sponsor on 26-Dec-2021. She acknowledged understanding of the updated information, and verbally consented Anav's continued participation in the remaining end of study activities. She understood and agreed that Mad[place]x will provide his written signature on the Main Study ICF version 13-Dec-2021 at his next scheduled visit.

## 2022-01-13 ENCOUNTER — Ambulatory Visit: Payer: Medicare Other

## 2022-01-20 ENCOUNTER — Encounter: Payer: Medicare Other | Admitting: *Deleted

## 2022-01-20 DIAGNOSIS — Z006 Encounter for examination for normal comparison and control in clinical research program: Secondary | ICD-10-CM

## 2022-01-20 DIAGNOSIS — J84112 Idiopathic pulmonary fibrosis: Secondary | ICD-10-CM

## 2022-01-20 NOTE — Research (Signed)
Title: FGCL-3019-095 (FibroGen Study) is a Phase 3, randomized, double-blind, placebo-controlled multicenter international study to evaluate the efficacy and safety of 30 mg/kg IV infusions of pamrevlumab administered every 3 weeks for 52 weeks as compared to placebo in subjects with Idiopathic Pulmonary Fibrosis. Primary end point is: change in FVC from baseline at week 52. ZEPHYRUS II STUDY  Protocol #: FGCL-3019-095, Clinical Trials #: [id number] Sponsor: [email or web address]  ([place], [place], [place])  [facility] [employer] Coordinator note :   This visit for Subject Dylan Shea with DOB: 12-Feb-1951 on 01/20/2022 for the above protocol is Visit/Encounter # 28 days from Last Dose  and is for purpose of research.   The consent for this encounter is under Protocol: Protocol Amendment 2.1 (27Feb23)  IB: version 21 ([id number]) Main ICF: version 13Jun2023   Optional Genetic Consent: version 13Jun2023 OLE ICF: version Kathi Ludwig is currently IRB approved.   Subject expressed continued interest and consent in continuing as a study subject. Subject confirmed that there was no change in contact information (e.g. address, telephone, email). Subject thanked for participation in research and contribution to science.   The Subject was informed that the [place] continues to have oversight of the subject's visits and course  through relevant discussions, reviews and also specifically of this visit by routing of this note to the PI.  Subject was reconsented to the latest version of the main consent form prior to any research procedures taking place. Refer to the subjects paper source binder for more details on the informed consent process.   All procedures completed per the above mentioned protocol. Subject stated interest in participating in other clinical trials available, will have [facility] send consent forms for the subject to review and staff will follow-up with the patient in a couple  weeks to discuss further interest.  Refer to the subjects paper source binder for further details of the visit.      Signed by  Jon Billings, [place] Coordinator [facility]  [place], [place] 12:46 PM 01/20/2022

## 2022-01-23 NOTE — Telephone Encounter (Signed)
PI OVERSIGHT ATTESTATION  I the Principal Investigator (PI) for the above mentioned study attest that I reviewed the mentioned  clinical research coordinator  notes on research subject  Dylan Shea  born March 02, 1951 . I  agree with the findings mentioned above   Dr.Mykela Mewborn Chase Caller, MD Pulmonary and [place] Staff Physician [facility] [place] [facility] Pager: [phone], If no answer or between  15:00h - 7:00h: call [phone]  01/23/2022 5:40 AM

## 2022-01-31 ENCOUNTER — Telehealth: Payer: Self-pay | Admitting: Internal Medicine

## 2022-01-31 DIAGNOSIS — J84112 Idiopathic pulmonary fibrosis: Secondary | ICD-10-CM

## 2022-01-31 DIAGNOSIS — Z006 Encounter for examination for normal comparison and control in clinical research program: Secondary | ICD-10-CM

## 2022-01-31 NOTE — Telephone Encounter (Signed)
I spoke with Dylan Shea on 31-Jan-2022 at 10:10 a.m. to inform her of the incorrect verbiage regarding travel reimbursement in the main study ICF version 03Apr2023 and main study ICF version 13Jun2023, and reassured her that there has not been a gap in [employer] for participation in the research study. Mrs. Francom acknowledged understanding of this updated information and did not have any questions.

## 2022-02-01 NOTE — Telephone Encounter (Signed)
PI OVERSIGHT ATTESTATION  I the Principal Investigator (PI) for the above mentioned study attest that I reviewed the mentioned  clinical research coordinator  notes on research subject  Dylan Shea  born 10-20-1950 . I  agree with the findings mentioned above   Dr.Ajani Schnieders Chase Caller, MD Pulmonary and [place] Staff Physician [facility] [place] [facility] [place] [facility] Pager: [phone], If no answer or between  15:00h - 7:00h: call [phone]  02/01/2022 10:53 AM

## 2022-02-03 ENCOUNTER — Ambulatory Visit: Payer: Medicare Other

## 2022-02-15 ENCOUNTER — Encounter (HOSPITAL_COMMUNITY): Payer: Self-pay

## 2022-02-15 ENCOUNTER — Encounter (HOSPITAL_COMMUNITY)
Admit: 2022-02-15 | Discharge: 2022-02-15 | Disposition: A | Discharge status: Home / Self Care | Payer: Medicare Other | Attending: Gastroenterology | Admitting: Gastroenterology

## 2022-02-17 ENCOUNTER — Ambulatory Visit (HOSPITAL_COMMUNITY)
Admission: RE | Admit: 2022-02-17 | Discharge: 2022-02-17 | Disposition: A | Discharge status: Home / Self Care | Payer: Medicare Other | Attending: Gastroenterology | Admitting: Gastroenterology

## 2022-02-17 ENCOUNTER — Encounter (HOSPITAL_COMMUNITY)
Admission: RE | Disposition: A | Discharge status: Home / Self Care | Payer: Self-pay | Source: Home / Self Care | Attending: Gastroenterology

## 2022-02-17 ENCOUNTER — Ambulatory Visit (HOSPITAL_BASED_OUTPATIENT_CLINIC_OR_DEPARTMENT_OTHER): Payer: Medicare Other | Admitting: Anesthesiology

## 2022-02-17 ENCOUNTER — Ambulatory Visit (HOSPITAL_COMMUNITY): Payer: Medicare Other | Admitting: Anesthesiology

## 2022-02-17 DIAGNOSIS — K648 Other hemorrhoids: Secondary | ICD-10-CM | POA: Insufficient documentation

## 2022-02-17 DIAGNOSIS — D122 Benign neoplasm of ascending colon: Secondary | ICD-10-CM | POA: Diagnosis not present

## 2022-02-17 DIAGNOSIS — K219 Gastro-esophageal reflux disease without esophagitis: Secondary | ICD-10-CM | POA: Insufficient documentation

## 2022-02-17 DIAGNOSIS — I10 Essential (primary) hypertension: Secondary | ICD-10-CM | POA: Insufficient documentation

## 2022-02-17 DIAGNOSIS — E785 Hyperlipidemia, unspecified: Secondary | ICD-10-CM | POA: Insufficient documentation

## 2022-02-17 DIAGNOSIS — E119 Type 2 diabetes mellitus without complications: Secondary | ICD-10-CM | POA: Diagnosis not present

## 2022-02-17 DIAGNOSIS — K635 Polyp of colon: Secondary | ICD-10-CM | POA: Diagnosis not present

## 2022-02-17 DIAGNOSIS — D123 Benign neoplasm of transverse colon: Secondary | ICD-10-CM | POA: Diagnosis not present

## 2022-02-17 DIAGNOSIS — Z79899 Other long term (current) drug therapy: Secondary | ICD-10-CM | POA: Diagnosis not present

## 2022-02-17 DIAGNOSIS — Z794 Long term (current) use of insulin: Secondary | ICD-10-CM

## 2022-02-17 DIAGNOSIS — R634 Abnormal weight loss: Secondary | ICD-10-CM | POA: Diagnosis not present

## 2022-02-17 HISTORY — PX: COLONOSCOPY WITH PROPOFOL: SHX5780

## 2022-02-17 HISTORY — PX: POLYPECTOMY: SHX149

## 2022-02-17 LAB — GLUCOSE, CAPILLARY: Glucose-Capillary: 144 mg/dL — ABNORMAL HIGH (ref 70–99)

## 2022-02-17 LAB — HM COLONOSCOPY

## 2022-02-17 SURGERY — COLONOSCOPY WITH PROPOFOL
Anesthesia: General

## 2022-02-17 MED ORDER — EPHEDRINE SULFATE (PRESSORS) 50 MG/ML IJ SOLN
INTRAMUSCULAR | Status: DC | PRN
  Administered 2022-02-17: 15 mg via INTRAVENOUS

## 2022-02-17 MED ORDER — PHENYLEPHRINE HCL (PRESSORS) 10 MG/ML IV SOLN
INTRAVENOUS | Status: DC | PRN
  Administered 2022-02-17: 160 ug via INTRAVENOUS
  Administered 2022-02-17: 240 ug via INTRAVENOUS
  Administered 2022-02-17: 160 ug via INTRAVENOUS

## 2022-02-17 MED ORDER — LACTATED RINGERS IV SOLN
INTRAVENOUS | Status: DC
Start: 2022-02-17 — End: 2022-02-17

## 2022-02-17 MED ORDER — LACTATED RINGERS IV SOLN: INTRAVENOUS | Status: DC | PRN

## 2022-02-17 MED ORDER — PROPOFOL 500 MG/50ML IV EMUL
INTRAVENOUS | Status: DC | PRN
  Administered 2022-02-17: 100 ug/kg/min via INTRAVENOUS

## 2022-02-17 MED ORDER — LIDOCAINE HCL (CARDIAC) PF 100 MG/5ML IV SOSY
PREFILLED_SYRINGE | INTRAVENOUS | Status: DC | PRN
  Administered 2022-02-17: 60 mg via INTRATRACHEAL

## 2022-02-17 MED ORDER — PROPOFOL 500 MG/50ML IV EMUL: INTRAVENOUS | Status: DC | PRN

## 2022-02-17 MED ORDER — PROPOFOL 10 MG/ML IV BOLUS
INTRAVENOUS | Status: DC | PRN
  Administered 2022-02-17: 80 mg via INTRAVENOUS

## 2022-02-17 NOTE — H&P (Signed)
Dylan Shea is an 71 y.o. male.   Chief Complaint: Weight loss  HPI: 71 year old male with past medical history of diabetes, GERD, hyperlipidemia, hypertension, IPF and diabetes, who comes for evaluation of weight loss .  Patient has presented continued weight loss.  Underwent EGD that did not show any abnormalities that would explain weight loss.  Past Medical History:  Diagnosis Date   Diabetes ([place]) 01/10/2017   Dyspnea    GERD (gastroesophageal reflux disease)    Hypercholesteremia    Hypertension    Pulmonary fibrosis ([place]) 07/2021    Past Surgical History:  Procedure Laterality Date   BIOPSY  01/05/2022   Procedure: BIOPSY;  Surgeon: [name], MD;  [facility];  Service: Gastroenterology;;   COLONOSCOPY     ESOPHAGEAL DILATION N/A 04/26/2017   Procedure: ESOPHAGEAL DILATION;  Surgeon: Rogene Houston, MD;  Location[facility];  Service: Endoscopy;  Laterality: N/A;   ESOPHAGEAL DILATION  01/05/2022   Procedure: ESOPHAGEAL DILATION;  Surgeon: [name], MD;  [facility];  Service: Gastroenterology;;   ESOPHAGOGASTRODUODENOSCOPY N/A 04/26/2017   Procedure: ESOPHAGOGASTRODUODENOSCOPY (EGD);  Surgeon: Rogene Houston, MD;  [facility];  Service: Endoscopy;  Laterality: N/A;  12:45   ESOPHAGOGASTRODUODENOSCOPY (EGD) WITH PROPOFOL N/A 01/05/2022   Procedure: ESOPHAGOGASTRODUODENOSCOPY (EGD) WITH PROPOFOL;  Surgeon: [name], MD;  [facility];  Service: Gastroenterology;  Laterality: N/A;  25    Family History  Problem Relation Age of Onset   Emphysema Mother    Social History:  reports that he has never smoked. He has never used smokeless tobacco. He reports that he does not drink alcohol and does not use drugs.  Allergies: No Known Allergies  Medications Prior to Admission  Medication Sig Dispense Refill   albuterol (VENTOLIN HFA) 108 (90 Base) MCG/ACT inhaler Inhale 1-2  puffs into the lungs every 6 (six) hours as needed. 8 g 2   clopidogrel (PLAVIX) 75 MG tablet Take 75 mg by mouth daily.     oxyCODONE-acetaminophen (PERCOCET/ROXICET) 5-325 MG tablet Take 1 tablet by mouth 3 (three) times daily as needed for pain.     atorvastatin (LIPITOR) 10 MG tablet Take 10 mg by mouth daily.     insulin glargine (LANTUS) 100 UNIT/ML injection Inject 20 Units into the skin daily as needed (if blood glucose is over 130).     levothyroxine (SYNTHROID) 50 MCG tablet Take 50 mcg by mouth daily before breakfast.     losartan (COZAAR) 100 MG tablet Take 100 mg by mouth daily.     polyethylene glycol-electrolytes (TRILYTE) 420 g solution Take 4,000 mLs by mouth as directed. 4000 mL 0   STUDY - ZEPHYRUS II - pamrevlumab or placebo 10 mg/mL IV infusion (PI-RAMASWAMY) Inject 30 mg/kg into the vein once. For investigational use only - Administration rate per protocol. For IV administration only in a dedicated line with a 0.2 micron in-line filer.Given every 3 weeks in clinic. (Patient not taking: Reported on 02/09/2022)      Results for orders placed or performed during the hospital encounter of 02/17/22 (from the past 48 hour(s))  Glucose, capillary     Status: Abnormal   Collection Time: 02/17/22  8:59 AM  Result Value Ref Range   Glucose-Capillary 144 (H) 70 - 99 mg/dL    Comment: Glucose reference range applies only to samples taken after fasting for at least 8 hours.   No results found.  Review of Systems  Constitutional:  Positive for unexpected weight change.  HENT: Negative.    Eyes: Negative.   Respiratory: Negative.    Cardiovascular: Negative.   Gastrointestinal: Negative.   Endocrine: Negative.   Genitourinary: Negative.   Musculoskeletal: Negative.   Skin: Negative.   Allergic/Immunologic: Negative.   Neurological: Negative.   Hematological: Negative.   Psychiatric/Behavioral: Negative.      Blood pressure 130/80, pulse 98, temperature 97.7 F (36.5 C),  temperature source Oral, resp. rate 20, height '6\' 1"'$  (1.854 m), weight 68 kg, SpO2 96 %. Physical Exam  GENERAL: The patient is AO x3, in no acute distress. HEENT: Head is normocephalic and atraumatic. EOMI are intact. Mouth is well hydrated and without lesions. NECK: Supple. No masses LUNGS: Clear to auscultation. No presence of rhonchi/wheezing/rales. Adequate chest expansion HEART: RRR, normal s1 and s2. ABDOMEN: Soft, nontender, no guarding, no peritoneal signs, and nondistended. BS +. No masses. EXTREMITIES: Without any cyanosis, clubbing, rash, lesions or edema. NEUROLOGIC: AOx3, no focal motor deficit. SKIN: no jaundice, no rashes  Assessment/Plan 71 year old male with past medical history of diabetes, GERD, hyperlipidemia, hypertension, IPF and diabetes, who comes for evaluation of weight loss .  We will proceed with colonoscopy.  [name], MD 02/17/2022, 9:05 AM

## 2022-02-17 NOTE — Transfer of Care (Signed)
Immediate Anesthesia Transfer of Care Note  Patient: Dylan Shea  Procedure(s) Performed: COLONOSCOPY WITH PROPOFOL POLYPECTOMY INTESTINAL  Patient Location: Short Stay  Anesthesia Type:General  Level of Consciousness: sedated  Airway & Oxygen Therapy: Patient Spontanous Breathing  Post-op Assessment: Report given to RN and Post -op Vital signs reviewed and stable  Post vital signs: Reviewed and stable  Last Vitals:  Vitals Value Taken Time  BP 120/57   Temp 36   Pulse 106   Resp 16   SpO2 96     Last Pain:  Vitals:   02/17/22 0907  TempSrc:   PainSc: 0-No pain      Patients Stated Pain Goal: 6 (17/79/39 0300)  Complications: No notable events documented.

## 2022-02-17 NOTE — Discharge Instructions (Addendum)
You are being discharged to home.  Resume your previous diet.  We are waiting for your pathology results.  Your physician has recommended a repeat colonoscopy for surveillance based on pathology results.  Restart Plavix tomorrow. 

## 2022-02-17 NOTE — Op Note (Signed)
[facility] Patient Name: Dylan Shea Procedure Date: 02/17/2022 8:58 AM MRN: [id number] Date of Birth: 07-19-1950 Attending MD: Maylon Peppers ,  CSN: [id number] Age: 71 Admit Type: Outpatient Procedure:                Colonoscopy Indications:              Weight loss Providers:                Maylon Peppers, Crystal Page, Raphael Gibney,                            Technician Referring MD:              Medicines:                Monitored Anesthesia Care Complications:            No immediate complications. Estimated Blood Loss:     Estimated blood loss: none. Procedure:                Pre-Anesthesia Assessment:                           - Prior to the procedure, a History and Physical                            was performed, and patient medications, allergies                            and sensitivities were reviewed. The patient's                            tolerance of previous anesthesia was reviewed.                           - The risks and benefits of the procedure and the                            sedation options and risks were discussed with the                            patient. All questions were answered and informed                            consent was obtained.                           - ASA Grade Assessment: III - A patient with severe                            systemic disease.                           After obtaining informed consent, the colonoscope                            was passed under direct vision. Throughout the  procedure, the patient's blood pressure, pulse, and                            oxygen saturations were monitored continuously. The                            [id number]-HQ190L ([id number]) scope was introduced through                            the anus and advanced to the the terminal ileum.                            The colonoscopy was performed without difficulty.                            The patient  tolerated the procedure well. The                            quality of the bowel preparation was good. Scope In: 9:10:58 AM Scope Out: 9:38:05 AM Scope Withdrawal Time: 0 hours 20 minutes 23 seconds  Total Procedure Duration: 0 hours 27 minutes 7 seconds  Findings:      The perianal and digital rectal examinations were normal.      The terminal ileum appeared normal.      A 1 mm polyp was found in the ascending colon. The polyp was sessile.       The polyp was removed with a cold biopsy forceps. Resection and       retrieval were complete.      Two sessile polyps were found in the ascending colon. The polyps were 3       to 4 mm in size. These polyps were removed with a cold snare. Resection       and retrieval were complete.      A 12 mm polyp was found in the transverse colon. The polyp was sessile.       The polyp was removed with a hot snare. Resection and retrieval were       complete.      Non-bleeding internal hemorrhoids were found during retroflexion. The       hemorrhoids were small. Impression:               - The examined portion of the ileum was normal.                           - One 1 mm polyp in the ascending colon, removed                            with a cold biopsy forceps. Resected and retrieved.                           - Two 3 to 4 mm polyps in the ascending colon,                            removed with a cold snare. Resected and retrieved.                           -  One 12 mm polyp in the transverse colon, removed                            with a hot snare. Resected and retrieved.                           - Non-bleeding internal hemorrhoids. Moderate Sedation:      Per Anesthesia Care Recommendation:           - Discharge patient to home (ambulatory).                           - Resume previous diet.                           - Await pathology results.                           - Repeat colonoscopy for surveillance based on                             pathology results.                           - Restart Plavix tomorrow. Procedure Code(s):        --- Professional ---                           [phone], Colonoscopy, flexible; with removal of                            tumor(s), polyp(s), or other lesion(s) by snare                            technique                           45380, 59, Colonoscopy, flexible; with biopsy,                            single or multiple Diagnosis Code(s):        --- Professional ---                           K63.5, Polyp of colon                           K64.8, Other hemorrhoids                           R63.4, Abnormal weight loss CPT copyright 2019 American Medical Association. All rights reserved. The codes documented in this report are preliminary and upon coder review may  be revised to meet current compliance requirements. Maylon Peppers, MD Maylon Peppers,  02/17/2022 9:49:26 AM This report has been signed electronically. Number of Addenda: 0

## 2022-02-17 NOTE — Anesthesia Preprocedure Evaluation (Signed)
Anesthesia Evaluation  Patient identified by MRN, date of birth, ID band Patient awake    Reviewed: Allergy & Precautions, NPO status , Patient's Chart, lab work & pertinent test results  Airway Mallampati: II  TM Distance: >3 FB Neck ROM: Full    Dental  (+) Dental Advisory Given, Missing, Poor Dentition, Chipped   Pulmonary neg pulmonary ROS, shortness of breath and with exertion,    Pulmonary exam normal breath sounds clear to auscultation       Cardiovascular hypertension, Pt. on medications Normal cardiovascular exam Rhythm:Regular Rate:Normal     Neuro/Psych negative neurological ROS  negative psych ROS   GI/Hepatic Neg liver ROS, GERD  Medicated and Controlled,  Endo/Other  diabetes, Well Controlled, Type 2, Oral Hypoglycemic Agents, Insulin Dependent  Renal/GU negative Renal ROS  negative genitourinary   Musculoskeletal negative musculoskeletal ROS (+)   Abdominal   Peds negative pediatric ROS (+)  Hematology negative hematology ROS (+)   Anesthesia Other Findings   Reproductive/Obstetrics negative OB ROS                           Anesthesia Physical  Anesthesia Plan  ASA: 3  Anesthesia Plan: General   Post-op Pain Management: Minimal or no pain anticipated   Induction: Intravenous  PONV Risk Score and Plan: Propofol infusion  Airway Management Planned: Nasal Cannula and Natural Airway  Additional Equipment:   Intra-op Plan:   Post-operative Plan:   Informed Consent: I have reviewed the patients History and Physical, chart, labs and discussed the procedure including the risks, benefits and alternatives for the proposed anesthesia with the patient or authorized representative who has indicated his/her understanding and acceptance.     Dental advisory given  Plan Discussed with: CRNA and Surgeon  Anesthesia Plan Comments: (Risk of aspiration pneumonia and postop  ventilation was explained,Possible GA with ETT was explained.)       Anesthesia Quick Evaluation

## 2022-02-17 NOTE — Anesthesia Postprocedure Evaluation (Signed)
Anesthesia Post Note  Patient: Alize Acy  Procedure(s) Performed: COLONOSCOPY WITH PROPOFOL POLYPECTOMY INTESTINAL  Patient location during evaluation: Phase II Anesthesia Type: General Level of consciousness: awake and alert and oriented Pain management: pain level controlled Vital Signs Assessment: post-procedure vital signs reviewed and stable Respiratory status: spontaneous breathing, nonlabored ventilation and respiratory function stable Cardiovascular status: blood pressure returned to baseline and stable Postop Assessment: no apparent nausea or vomiting Anesthetic complications: no   No notable events documented.   Last Vitals:  Vitals:   02/17/22 0854 02/17/22 0942  BP: 130/80 (!) 107/51  Pulse: 98 (!) 113  Resp: 20 20  Temp: 36.5 C   SpO2: 96% 97%    Last Pain:  Vitals:   02/17/22 0942  TempSrc: Oral  PainSc:                  Oluwademilade Mckiver C Larz Mark

## 2022-02-20 ENCOUNTER — Encounter (INDEPENDENT_AMBULATORY_CARE_PROVIDER_SITE_OTHER): Payer: Self-pay | Admitting: *Deleted

## 2022-02-20 LAB — SURGICAL PATHOLOGY

## 2022-02-21 ENCOUNTER — Encounter: Payer: Medicare Other | Admitting: *Deleted

## 2022-02-21 DIAGNOSIS — Z006 Encounter for examination for normal comparison and control in clinical research program: Secondary | ICD-10-CM

## 2022-02-21 DIAGNOSIS — J84112 Idiopathic pulmonary fibrosis: Secondary | ICD-10-CM

## 2022-02-21 NOTE — Research (Signed)
Title: FGCL-3019-095 ([employer] Study) is a Phase 3, randomized, double-blind, placebo-controlled multicenter international study to evaluate the efficacy and safety of 30 mg/kg IV infusions of pamrevlumab administered every 3 weeks for 52 weeks as compared to placebo in subjects with Idiopathic Pulmonary Fibrosis. Primary end point is: change in FVC from baseline at week 52. ZEPHYRUS II STUDY  Protocol #: FGCL-3019-095, Clinical Trials #: [id number] Sponsor: [email or web address]  ([place], [place], [place])  [facility] @ [employer] Coordinator note :   This visit for Subject Dylan Shea with DOB: [birth year] on 02/21/2022 for the above protocol is Visit/Encounter # 60-day safety follow up and is for purpose of research.   The consent for this encounter is under Protocol: Protocol Amendment 2.1 (27Feb23)  IB: version 21 ([id number]) Main ICF: version 13Jun2023   Optional Genetic Consent: version 13Jun2023 OLE ICF: version 03Apr23  Subject expressed continued interest and consent in continuing as a study subject. Subject confirmed that there was no change in contact information (e.g. address, telephone, email). Subject thanked for participation in research and contribution to science.    The Subject was informed that the PI Dr. Brand Males continues to have oversight of the subject's visits and course through relevant discussions, reviews and also specifically of this visit by routing of this note to the [place].   Today's visit was conducted remotely via telephone as per protocol. The patient reported that he has experienced worsening fatigue and shortness of breath for the last 2-weeks. The shortness of breath occurs with activity and at rest. He reports no change in his dysphagia s/p esophageal dilation. He is not sure if he continues to have weight loss as he admits to not weighing himself recently and has maintained a good appetite. Please refer to paper source subject binder for  further details.     Signed by  Lytle Michaels  Clinical Research Coordinator [facility]  [place], [place] 3:51 PM 02/21/2022

## 2022-02-24 ENCOUNTER — Encounter: Payer: Medicare Other | Admitting: Adult Health

## 2022-02-24 ENCOUNTER — Encounter (HOSPITAL_COMMUNITY): Payer: Self-pay | Admitting: Gastroenterology

## 2022-02-24 ENCOUNTER — Ambulatory Visit: Payer: Medicare Other

## 2022-02-27 ENCOUNTER — Ambulatory Visit (HOSPITAL_COMMUNITY): Payer: Medicare Other | Attending: Cardiology

## 2022-02-27 DIAGNOSIS — J84112 Idiopathic pulmonary fibrosis: Secondary | ICD-10-CM | POA: Insufficient documentation

## 2022-02-27 DIAGNOSIS — R0609 Other forms of dyspnea: Secondary | ICD-10-CM | POA: Diagnosis not present

## 2022-02-27 LAB — ECHOCARDIOGRAM COMPLETE
Area-P 1/2: 4.36 cm2
S' Lateral: 2.1 cm

## 2022-03-01 ENCOUNTER — Telehealth: Payer: Self-pay | Admitting: Internal Medicine

## 2022-03-01 NOTE — Telephone Encounter (Signed)
    ECHO 02/27/22 -> Mild pulmonary hypertension on ECHO -> refer cardiology for consideration of RHC. If the [facility] cards docs do not do it then refer [facility]    IMPRESSIONS     1. Left ventricular ejection fraction, by estimation, is 60 to 65%. The  left ventricle has normal function. The left ventricle has no regional  wall motion abnormalities. There is mild left ventricular hypertrophy.  Left ventricular diastolic parameters  are consistent with Grade I diastolic dysfunction (impaired relaxation).   2. Right ventricular systolic function is normal. The right ventricular  size is normal. There is mildly elevated pulmonary artery systolic  pressure.   3. The mitral valve is normal in structure. No evidence of mitral valve  regurgitation.   4. The aortic valve is tricuspid. Aortic valve regurgitation is not  visualized. Aortic valve sclerosis is present, with no evidence of aortic  valve stenosis.

## 2022-03-03 NOTE — Telephone Encounter (Signed)
Attempted to call pt but unable to reach. Unable to leave VM as mailbox is full. Will try to call back later. 

## 2022-03-08 ENCOUNTER — Encounter: Payer: Self-pay | Admitting: *Deleted

## 2022-03-08 NOTE — Telephone Encounter (Signed)
Attempted to call pt but unable to reach. Unable to leave VM as mailbox was full. Due to multiple attempts trying to reach pt and unable to do so, letter will be sent to pt and encounter will be closed.

## 2022-03-13 ENCOUNTER — Encounter (HOSPITAL_COMMUNITY): Payer: Self-pay | Admitting: *Deleted

## 2022-03-13 ENCOUNTER — Inpatient Hospital Stay (HOSPITAL_COMMUNITY)
Admission: EM | Admit: 2022-03-13 | Discharge: 2022-03-16 | DRG: 871 | Disposition: A | Discharge status: Hospice | Payer: Medicare Other | Attending: Internal Medicine | Admitting: Internal Medicine

## 2022-03-13 ENCOUNTER — Other Ambulatory Visit: Payer: Self-pay

## 2022-03-13 ENCOUNTER — Emergency Department (HOSPITAL_COMMUNITY): Payer: Medicare Other

## 2022-03-13 DIAGNOSIS — Z79890 Hormone replacement therapy (postmenopausal): Secondary | ICD-10-CM

## 2022-03-13 DIAGNOSIS — J90 Pleural effusion, not elsewhere classified: Secondary | ICD-10-CM | POA: Diagnosis not present

## 2022-03-13 DIAGNOSIS — I152 Hypertension secondary to endocrine disorders: Secondary | ICD-10-CM | POA: Diagnosis not present

## 2022-03-13 DIAGNOSIS — Z91199 Patient's noncompliance with other medical treatment and regimen due to unspecified reason: Secondary | ICD-10-CM

## 2022-03-13 DIAGNOSIS — J690 Pneumonitis due to inhalation of food and vomit: Secondary | ICD-10-CM | POA: Diagnosis not present

## 2022-03-13 DIAGNOSIS — J8410 Pulmonary fibrosis, unspecified: Secondary | ICD-10-CM

## 2022-03-13 DIAGNOSIS — R1310 Dysphagia, unspecified: Secondary | ICD-10-CM | POA: Diagnosis present

## 2022-03-13 DIAGNOSIS — J9312 Secondary spontaneous pneumothorax: Secondary | ICD-10-CM | POA: Diagnosis not present

## 2022-03-13 DIAGNOSIS — W19XXXA Unspecified fall, initial encounter: Secondary | ICD-10-CM

## 2022-03-13 DIAGNOSIS — J9601 Acute respiratory failure with hypoxia: Secondary | ICD-10-CM | POA: Diagnosis not present

## 2022-03-13 DIAGNOSIS — Z515 Encounter for palliative care: Secondary | ICD-10-CM | POA: Diagnosis not present

## 2022-03-13 DIAGNOSIS — Z23 Encounter for immunization: Secondary | ICD-10-CM

## 2022-03-13 DIAGNOSIS — E119 Type 2 diabetes mellitus without complications: Secondary | ICD-10-CM

## 2022-03-13 DIAGNOSIS — J9383 Other pneumothorax: Secondary | ICD-10-CM | POA: Diagnosis not present

## 2022-03-13 DIAGNOSIS — Z20822 Contact with and (suspected) exposure to covid-19: Secondary | ICD-10-CM | POA: Diagnosis present

## 2022-03-13 DIAGNOSIS — I4891 Unspecified atrial fibrillation: Secondary | ICD-10-CM | POA: Diagnosis not present

## 2022-03-13 DIAGNOSIS — K219 Gastro-esophageal reflux disease without esophagitis: Secondary | ICD-10-CM | POA: Diagnosis not present

## 2022-03-13 DIAGNOSIS — J849 Interstitial pulmonary disease, unspecified: Secondary | ICD-10-CM | POA: Diagnosis present

## 2022-03-13 DIAGNOSIS — E7800 Pure hypercholesterolemia, unspecified: Secondary | ICD-10-CM | POA: Diagnosis present

## 2022-03-13 DIAGNOSIS — Y92009 Unspecified place in unspecified non-institutional (private) residence as the place of occurrence of the external cause: Secondary | ICD-10-CM | POA: Diagnosis not present

## 2022-03-13 DIAGNOSIS — Z794 Long term (current) use of insulin: Secondary | ICD-10-CM | POA: Diagnosis not present

## 2022-03-13 DIAGNOSIS — E43 Unspecified severe protein-calorie malnutrition: Secondary | ICD-10-CM | POA: Diagnosis present

## 2022-03-13 DIAGNOSIS — J982 Interstitial emphysema: Secondary | ICD-10-CM

## 2022-03-13 DIAGNOSIS — J939 Pneumothorax, unspecified: Secondary | ICD-10-CM | POA: Diagnosis not present

## 2022-03-13 DIAGNOSIS — S0003XA Contusion of scalp, initial encounter: Secondary | ICD-10-CM | POA: Diagnosis present

## 2022-03-13 DIAGNOSIS — E785 Hyperlipidemia, unspecified: Secondary | ICD-10-CM | POA: Diagnosis not present

## 2022-03-13 DIAGNOSIS — E1159 Type 2 diabetes mellitus with other circulatory complications: Secondary | ICD-10-CM | POA: Diagnosis present

## 2022-03-13 DIAGNOSIS — Z79899 Other long term (current) drug therapy: Secondary | ICD-10-CM | POA: Diagnosis not present

## 2022-03-13 DIAGNOSIS — S0990XA Unspecified injury of head, initial encounter: Principal | ICD-10-CM

## 2022-03-13 DIAGNOSIS — E1169 Type 2 diabetes mellitus with other specified complication: Secondary | ICD-10-CM | POA: Diagnosis present

## 2022-03-13 DIAGNOSIS — J189 Pneumonia, unspecified organism: Secondary | ICD-10-CM

## 2022-03-13 DIAGNOSIS — R000 Tachycardia, unspecified: Secondary | ICD-10-CM | POA: Diagnosis not present

## 2022-03-13 DIAGNOSIS — I10 Essential (primary) hypertension: Secondary | ICD-10-CM

## 2022-03-13 DIAGNOSIS — Z7902 Long term (current) use of antithrombotics/antiplatelets: Secondary | ICD-10-CM | POA: Diagnosis not present

## 2022-03-13 DIAGNOSIS — Z681 Body mass index (BMI) 19 or less, adult: Secondary | ICD-10-CM | POA: Diagnosis not present

## 2022-03-13 DIAGNOSIS — E039 Hypothyroidism, unspecified: Secondary | ICD-10-CM | POA: Diagnosis not present

## 2022-03-13 DIAGNOSIS — Z7189 Other specified counseling: Secondary | ICD-10-CM | POA: Diagnosis not present

## 2022-03-13 DIAGNOSIS — A419 Sepsis, unspecified organism: Principal | ICD-10-CM

## 2022-03-13 DIAGNOSIS — R0902 Hypoxemia: Secondary | ICD-10-CM | POA: Diagnosis not present

## 2022-03-13 DIAGNOSIS — Z66 Do not resuscitate: Secondary | ICD-10-CM | POA: Diagnosis present

## 2022-03-13 DIAGNOSIS — J84112 Idiopathic pulmonary fibrosis: Secondary | ICD-10-CM | POA: Diagnosis not present

## 2022-03-13 LAB — CBC
HCT: 41.5 % (ref 39.0–52.0)
HCT: 37 % — ABNORMAL LOW (ref 39.0–52.0)
Hemoglobin: 13.5 g/dL (ref 13.0–17.0)
Hemoglobin: 12.2 g/dL — ABNORMAL LOW (ref 13.0–17.0)
MCH: 32.1 pg (ref 26.0–34.0)
MCH: 32.3 pg (ref 26.0–34.0)
MCHC: 32.5 g/dL (ref 30.0–36.0)
MCHC: 33 g/dL (ref 30.0–36.0)
MCV: 98.6 fL (ref 80.0–100.0)
MCV: 97.9 fL (ref 80.0–100.0)
Platelets: 369 10*3/uL (ref 150–400)
Platelets: 330 10*3/uL (ref 150–400)
RBC: 4.21 MIL/uL — ABNORMAL LOW (ref 4.22–5.81)
RBC: 3.78 MIL/uL — ABNORMAL LOW (ref 4.22–5.81)
RDW: 12.5 % (ref 11.5–15.5)
RDW: 12.6 % (ref 11.5–15.5)
WBC: 15.2 10*3/uL — ABNORMAL HIGH (ref 4.0–10.5)
WBC: 17.5 10*3/uL — ABNORMAL HIGH (ref 4.0–10.5)
nRBC: 0 % (ref 0.0–0.2)
nRBC: 0 % (ref 0.0–0.2)

## 2022-03-13 LAB — TSH: TSH: 1.434 u[IU]/mL (ref 0.350–4.500)

## 2022-03-13 LAB — BASIC METABOLIC PANEL
Anion gap: 12 (ref 5–15)
BUN: 18 mg/dL (ref 8–23)
CO2: 27 mmol/L (ref 22–32)
Calcium: 9 mg/dL (ref 8.9–10.3)
Chloride: 98 mmol/L (ref 98–111)
Creatinine, Ser: 0.98 mg/dL (ref 0.61–1.24)
GFR, Estimated: >60 mL/min (ref >60)
Glucose, Bld: 140 mg/dL — ABNORMAL HIGH (ref 70–99)
Potassium: 5.1 mmol/L (ref 3.5–5.1)
Sodium: 137 mmol/L (ref 135–145)

## 2022-03-13 LAB — BRAIN NATRIURETIC PEPTIDE: B Natriuretic Peptide: 92 pg/mL (ref 0.0–100.0)

## 2022-03-13 LAB — CREATININE, SERUM
Creatinine, Ser: 0.99 mg/dL (ref 0.61–1.24)
GFR, Estimated: >60 mL/min (ref >60)

## 2022-03-13 LAB — MAGNESIUM: Magnesium: 1.8 mg/dL (ref 1.7–2.4)

## 2022-03-13 LAB — LACTIC ACID, PLASMA
Lactic Acid, Venous: 1.5 mmol/L (ref 0.5–1.9)
Lactic Acid, Venous: 0.9 mmol/L (ref 0.5–1.9)

## 2022-03-13 LAB — SARS CORONAVIRUS 2 BY RT PCR: SARS Coronavirus 2 by RT PCR: NEGATIVE

## 2022-03-13 LAB — PROCALCITONIN: Procalcitonin: 0.28 ng/mL

## 2022-03-13 LAB — GLUCOSE, CAPILLARY: Glucose-Capillary: 99 mg/dL (ref 70–99)

## 2022-03-13 MED ORDER — ACETAMINOPHEN 325 MG PO TABS: 650.0000 mg | ORAL_TABLET | Freq: Four times a day (QID) | ORAL | Status: DC | PRN

## 2022-03-13 MED ORDER — ACETAMINOPHEN 650 MG RE SUPP: 650.0000 mg | Freq: Four times a day (QID) | RECTAL | Status: DC | PRN

## 2022-03-13 MED ORDER — ONDANSETRON HCL 4 MG/2ML IJ SOLN
4.0000 mg | Freq: Four times a day (QID) | INTRAMUSCULAR | Status: DC | PRN
  Administered 2022-03-14: 4 mg via INTRAVENOUS
  Filled 2022-03-13: qty 2

## 2022-03-13 MED ORDER — SODIUM CHLORIDE 0.9 % IV SOLN
3.0000 g | Freq: Four times a day (QID) | INTRAVENOUS | Status: DC
  Administered 2022-03-13: 3 g via INTRAVENOUS
  Filled 2022-03-13: qty 8

## 2022-03-13 MED ORDER — ENOXAPARIN SODIUM 40 MG/0.4ML IJ SOSY
40.0000 mg | PREFILLED_SYRINGE | INTRAMUSCULAR | Status: DC
  Administered 2022-03-13 – 2022-03-15 (×3): 40 mg via SUBCUTANEOUS
  Filled 2022-03-13 (×3): qty 0.4

## 2022-03-13 MED ORDER — SODIUM CHLORIDE 0.9 % IV SOLN
500.0000 mg | INTRAVENOUS | Status: DC
  Administered 2022-03-13 – 2022-03-15 (×3): 500 mg via INTRAVENOUS
  Filled 2022-03-13 (×2): qty 5

## 2022-03-13 MED ORDER — SODIUM CHLORIDE 0.9 % IV SOLN
2.0000 g | INTRAVENOUS | Status: DC
  Administered 2022-03-13 – 2022-03-15 (×3): 2 g via INTRAVENOUS
  Filled 2022-03-13 (×3): qty 20

## 2022-03-13 MED ORDER — LEVOTHYROXINE SODIUM 25 MCG PO TABS
50.0000 ug | ORAL_TABLET | Freq: Every day | ORAL | Status: DC
  Administered 2022-03-16: 50 ug via ORAL
  Filled 2022-03-13: qty 1
  Filled 2022-03-13 (×2): qty 2

## 2022-03-13 MED ORDER — PNEUMOCOCCAL 20-VAL CONJ VACC 0.5 ML IM SUSY: 0.5000 mL | PREFILLED_SYRINGE | INTRAMUSCULAR | Status: DC

## 2022-03-13 MED ORDER — ONDANSETRON HCL 4 MG PO TABS: 4.0000 mg | ORAL_TABLET | Freq: Four times a day (QID) | ORAL | Status: DC | PRN

## 2022-03-13 MED ORDER — INSULIN ASPART 100 UNIT/ML IJ SOLN
0.0000 [IU] | Freq: Three times a day (TID) | INTRAMUSCULAR | Status: DC
  Administered 2022-03-14: 3 [IU] via SUBCUTANEOUS
  Administered 2022-03-15 – 2022-03-16 (×5): 1 [IU] via SUBCUTANEOUS

## 2022-03-13 MED ORDER — HYDROMORPHONE HCL 1 MG/ML IJ SOLN
0.5000 mg | INTRAMUSCULAR | Status: DC | PRN
  Administered 2022-03-13 – 2022-03-15 (×9): 0.5 mg via INTRAVENOUS
  Filled 2022-03-13 (×9): qty 0.5

## 2022-03-13 MED ORDER — INSULIN ASPART 100 UNIT/ML IJ SOLN: 0.0000 [IU] | Freq: Every day | INTRAMUSCULAR | Status: DC

## 2022-03-13 MED ORDER — INFLUENZA VAC A&B SA ADJ QUAD 0.5 ML IM PRSY: 0.5000 mL | PREFILLED_SYRINGE | INTRAMUSCULAR | Status: DC

## 2022-03-13 MED ORDER — CLOPIDOGREL BISULFATE 75 MG PO TABS: 75.0000 mg | ORAL_TABLET | Freq: Every day | ORAL | Status: DC

## 2022-03-13 NOTE — ED Provider Notes (Signed)
[facility] Provider Note   CSN: [id number] Arrival date & time: 03/13/22  1421     History {Add pertinent medical, surgical, social history, OB history to HPI:1} Chief Complaint  Patient presents with   Dylan Shea is a 71 y.o. male with history of pulmonary fibrosis, diabetes, who reports he does not take any medications at home, presenting to the ED with shortness of breath and a fall.  The patient reports that he got lightheaded or fell while he was on the toilet today and struck his forehead.  His family member at the bedside reports that he does not take any medications at home, and he has been complaining of dyspnea for "a long time".  She reports that he gets winded walk even a few steps in the house.  He does not use nebulizer machine, inhaler, or any other medications.  He does see a pulmonologist.  External records reviewed including patient's pulmonology evaluation from July of this year, where the patient was terminated from a double-blind placebo controlled multicenter study.  He has noted to have history of idiopathic pulmonary fibrosis.  He was also noted to have chronic hoarseness of voice and recurring aspiration.  And also had an echocardiogram performed approximately 2 weeks ago per my review of the records with a normal EF of 60 to 66%, grade 1 diastolic dysfunction, mildly elevated pulmonary systolic pressure.  No significant valvular disease noted.  HPI     Home Medications Prior to Admission medications   Medication Sig Start Date End Date Taking? Authorizing Provider  albuterol (VENTOLIN HFA) 108 (90 Base) MCG/ACT inhaler Inhale 1-2 puffs into the lungs every 6 (six) hours as needed. 03/25/21   Parrett, Fonnie Mu, NP  atorvastatin (LIPITOR) 10 MG tablet Take 10 mg by mouth daily.    [provider]  clopidogrel (PLAVIX) 75 MG tablet Take 75 mg by mouth daily.    [provider]  insulin glargine (LANTUS) 100  UNIT/ML injection Inject 20 Units into the skin daily as needed (if blood glucose is over 130).    [provider]  levothyroxine (SYNTHROID) 50 MCG tablet Take 50 mcg by mouth daily before breakfast.    [provider]  losartan (COZAAR) 100 MG tablet Take 100 mg by mouth daily. 10/25/21   [provider]  oxyCODONE-acetaminophen (PERCOCET/ROXICET) 5-325 MG tablet Take 1 tablet by mouth 3 (three) times daily as needed for pain. 11/29/21   [provider]  STUDY - ZEPHYRUS II - pamrevlumab or placebo 10 mg/mL IV infusion (PI-RAMASWAMY) Inject 30 mg/kg into the vein once. For investigational use only - Administration rate per protocol. For IV administration only in a dedicated line with a 0.2 micron in-line filer.Given every 3 weeks in clinic. Patient not taking: Reported on 02/09/2022    Brand Males, MD      Allergies    Patient has no known allergies.    Review of Systems   Review of Systems  Physical Exam Updated Vital Signs BP 111/70   Pulse (!) 106   Temp 97.8 F (36.6 C) (Oral)   Resp 16   Ht '6\' 1"'$  (1.854 m)   Wt 63.5 kg   SpO2 92%   BMI 18.47 kg/m  Physical Exam Constitutional:      General: He is not in acute distress.    Comments: Thin, frail-appearing, hoarse voice  HENT:     Head: Normocephalic.     Comments: Hematoma to  left parietal scalp Eyes:     Conjunctiva/sclera: Conjunctivae normal.     Pupils: Pupils are equal, round, and reactive to light.  Cardiovascular:     Rate and Rhythm: Regular rhythm. Tachycardia present.  Pulmonary:     Effort: Pulmonary effort is normal. No respiratory distress.     Comments: 90-94% O2 on room air Abdominal:     General: There is no distension.     Tenderness: There is no abdominal tenderness.  Skin:    General: Skin is warm and dry.  Neurological:     General: No focal deficit present.     Mental Status: He is alert and oriented to person, place, and time. Mental status is at  baseline.  Psychiatric:        Mood and Affect: Mood normal.        Behavior: Behavior normal.     ED Results / Procedures / Treatments   Labs (all labs ordered are listed, but only abnormal results are displayed) Labs Reviewed  BASIC METABOLIC PANEL - Abnormal; Notable for the following components:      Result Value   Glucose, Bld 140 (*)    All other components within normal limits  CBC - Abnormal; Notable for the following components:   WBC 15.2 (*)    RBC 4.21 (*)    All other components within normal limits  SARS CORONAVIRUS 2 BY RT PCR  BRAIN NATRIURETIC PEPTIDE    EKG EKG Interpretation  Date/Time:  Monday March 13 2022 15:01:12 EDT Ventricular Rate:  115 PR Interval:  138 QRS Duration: 92 QT Interval:  305 QTC Calculation: 422 R Axis:   -53 Text Interpretation: Sinus tachycardia vs MAT Left anterior fascicular block Consider right ventricular hypertrophy No sig change from Jan 04 2022 tracing Confirmed by Octaviano Glow [phone]) on 03/13/2022 3:26:17 PM  Radiology CT Chest Wo Contrast  Result Date: 03/13/2022 CLINICAL DATA:  Interstitial lung disease and pulmonary fibrosis. Hypoxia. Evaluate for aspiration pneumonia. EXAM: CT CHEST WITHOUT CONTRAST TECHNIQUE: Multidetector CT imaging of the chest was performed following the standard protocol without IV contrast. RADIATION DOSE REDUCTION: This exam was performed according to the departmental dose-optimization program which includes automated exposure control, adjustment of the mA and/or kV according to patient size and/or use of iterative reconstruction technique. COMPARISON:  High-resolution chest CT 12/29/2021 FINDINGS: Cardiovascular: No significant vascular findings. Normal heart size. No pericardial effusion. There are atherosclerotic calcifications of the aorta and coronary arteries. Mediastinum/Nodes: There is moderate amount of diffuse pneumomediastinum which has increased from prior. Visualized esophagus and  thyroid gland are within normal limits. There is an enlarged subcarinal lymph node measuring 16 mm short axis which appears unchanged. Additional nonenlarged mediastinal lymph nodes appears similar to prior. Lungs/Pleura: There is a new small right pneumothorax (approximately 10%). There is no mediastinal shift. Peripheral interstitial and fibrotic changes are again noted similar to the prior study. There is some new patchy airspace disease in the bilateral lower lobes peripherally. There is no pleural effusion. The trachea and central airways appear patent. Upper Abdomen: No acute abnormality. Musculoskeletal: No chest wall mass or suspicious bone lesions identified. Degenerative changes affect the spine. IMPRESSION: 1. New small 10% right-sided pneumothorax. 2. Moderate pneumomediastinum has increased from prior. 3. New small amount of patchy airspace disease in the bilateral lower lobes worrisome for infection. 4. Otherwise stable fibrotic interstitial lung disease. Electronically Signed   By: Ronney Asters M.D.   On: 03/13/2022 16:31   CT Head Wo  Contrast  Result Date: 03/13/2022 CLINICAL DATA:  Head trauma, minor (Age >= 65y) fall head injury hematoma left parietal scalp EXAM: CT HEAD WITHOUT CONTRAST TECHNIQUE: Contiguous axial images were obtained from the base of the skull through the vertex without intravenous contrast. RADIATION DOSE REDUCTION: This exam was performed according to the departmental dose-optimization program which includes automated exposure control, adjustment of the mA and/or kV according to patient size and/or use of iterative reconstruction technique. COMPARISON:  None Available. FINDINGS: Brain: There is no acute intracranial hemorrhage, mass effect, or edema. Gray-white differentiation is preserved. There is no extra-axial fluid collection. Prominence of the ventricles and sulci reflects minor parenchymal volume loss. Patchy hypoattenuation in the supratentorial white matter is  nonspecific but may reflect mild chronic microvascular ischemic changes. Vascular: There is atherosclerotic calcification at the skull base. Skull: Calvarium is unremarkable. Sinuses/Orbits: No acute finding. Other: Mastoid air cells are clear. Left frontal scalp soft tissue swelling. IMPRESSION: No evidence of acute intracranial injury. Electronically Signed   By: Macy Mis M.D.   On: 03/13/2022 16:26    Procedures Procedures  {Document cardiac monitor, telemetry assessment procedure when appropriate:1}  Medications Ordered in ED Medications  Ampicillin-Sulbactam (UNASYN) 3 g in sodium chloride 0.9 % 100 mL IVPB (has no administration in time range)    ED Course/ Medical Decision Making/ A&P Clinical Course as of 03/13/22 1653  Mon Mar 13, 2022  1651 I spoke to Dr Melvyn Novas from pulmonary regarding small PTX -he advises that conservative care with supplemental oxygen would be sufficient at this point, no indication for emergent chest tube.  6 L nasal cannula per pulmonology recommendation started.  There is some concern for possible pneumonia, high risk aspiration, have ordered Unasyn as well.  I have a low suspicion for sepsis otherwise.  Family and patient updated regarding plan for medical admission.  Pulmonary team can be consulted in the hospital [MT]    Clinical Course User Index [MT] Janika Jedlicka, Carola Rhine, MD                           Medical Decision Making Amount and/or Complexity of Data Reviewed Labs: ordered. Radiology: ordered.  Risk Decision regarding hospitalization.   This patient presents to the ED with concern for shortness of breath, fall, head injury. This involves an extensive number of treatment options, and is a complaint that carries with it a high risk of complications and morbidity.    He has hematoma on the scalp and we will proceed with CT imaging of the head.  We will also obtain a CT scan of the lungs he does have a known history of pulmonary fibrosis but  also evaluate for possible aspiration pneumonia versus pleural effusion.  It is difficult to auscultate good breath sounds bilaterally, while at rest his oxygen level maintains above 90% and is not requiring supplemental oxygen, but he clearly has dyspnea on exertion, which is a chronic and ongoing issue.  Co-morbidities that complicate the patient evaluation: Pulmonary fibrosis, frailty, at high risk for pulmonary complication  Additional history obtained from family member at the bedside  External records from outside source obtained and reviewed including most recent echocardiogram and pulmonology office evaluation  I ordered and personally interpreted labs.  The pertinent results include: Elevation of baseline white blood cell count today, BMP within normal limits, BNP wnl  I ordered imaging studies including CT of the head, CT of the lung I independently visualized and  interpreted imaging which showed external hematoma of the scalp with no intracranial bleed.  CT of the lungs concerning for possible lower lobe pneumonia pattern, also small 10% spontaneous pneumothorax of the upper lungs, and pulmonary fibrosis pattern I agree with the radiologist interpretation  The patient was maintained on a cardiac monitor.  I personally viewed and interpreted the cardiac monitored which showed an underlying rhythm of: Sinus tachycardia  Per my interpretation the patient's ECG shows sinus tachycardia vs MAT without ischemia  I ordered medication including oxygen for pneumothorax and Unasyn for suspected aspiration pneumonia  Test Considered: I have lower suspicion for PE in this clinical setting.  After the interventions noted above, I reevaluated the patient and found that they have: stayed the same  Dispostion:  After consideration of the diagnostic results and the patients response to treatment, I feel that the patent would benefit from medical admission   {Document critical care time when  appropriate:1} {Document review of labs and clinical decision tools ie heart score, Chads2Vasc2 etc:1}  {Document your independent review of radiology images, and any outside records:1} {Document your discussion with family members, caretakers, and with consultants:1} {Document social determinants of health affecting pt's care:1} {Document your decision making why or why not admission, treatments were needed:1} Final Clinical Impression(s) / ED Diagnoses Final diagnoses:  Traumatic injury of head with hematoma of scalp  Spontaneous pneumothorax  Postinflammatory pulmonary fibrosis ([place])  Pneumonia of both lungs due to infectious organism, unspecified part of lung    Rx / DC Orders ED Discharge Orders     None

## 2022-03-13 NOTE — H&P (Signed)
History and Physical    Dylan Shea TOI:[id number] DOB: April 30, 1951 DOA: 03/13/2022  PCP: Neale Burly, MD  Patient coming from: Home  I have personally briefly reviewed patient's old medical records in [facility]  Chief Complaint: fall/shortness of breath  HPI: Dylan Shea is a 71 y.o. male with medical history significant of interstitial lung disease but not oxygen dependent, hypertension, diabetes, hyperlipidemia, GERD, dysphagia presented to ED with a complaint of fall and shortness of breath.  Apparently patient was in his bathroom today when he felt lightheaded and he fell and hit his head to the ground.  There is some bruise on his left head but no laceration.  Patient did not lose consciousness.  He denies having any chest pain, palpitation, headache, fever, chills, sweating, cough, any abdominal pain, any problem with urination or with bowel movements however he has been feeling short of breath for about a month which has gotten worse since about a week.  Patient also sees pulmonologist Dr. Chase Caller.  ED Course: Upon arrival to ED, he was tachycardic, tachypneic and has leukocytosis.  CT head unremarkable.  CT chest showed new small 10% right-sided pneumothorax and bilateral patchy opacities in the bases.  Patient was diagnosed with possible aspiration pneumonia.  He received Zosyn in the ED.  ED physician discussed with Dr. Melvyn Novas of pulmonology about his pneumothorax and he recommended conservative/oxygen therapy at the moment and no chest tube.  Hospitalist were consulted for admission.  COVID has been tested at negative.  Review of Systems: As per HPI otherwise negative.    Past Medical History:  Diagnosis Date   Diabetes ([place]) 01/10/2017   Dyspnea    GERD (gastroesophageal reflux disease)    Hypercholesteremia    Hypertension    Pulmonary fibrosis ([place]) 07/2021    Past Surgical History:  Procedure Laterality Date   BIOPSY  01/05/2022   Procedure: BIOPSY;   Surgeon: [name], MD;  [facility];  Service: Gastroenterology;;   COLONOSCOPY     COLONOSCOPY WITH PROPOFOL N/A 02/17/2022   Procedure: COLONOSCOPY WITH PROPOFOL;  Surgeon: [name], MD;  [facility];  Service: Gastroenterology;  Laterality: N/A;  900 ASA 2   ESOPHAGEAL DILATION N/A 04/26/2017   Procedure: ESOPHAGEAL DILATION;  Surgeon: Rogene Houston, MD;  [facility];  Service: Endoscopy;  Laterality: N/A;   ESOPHAGEAL DILATION  01/05/2022   Procedure: ESOPHAGEAL DILATION;  Surgeon: [name], MD;  [facility];  Service: Gastroenterology;;   ESOPHAGOGASTRODUODENOSCOPY N/A 04/26/2017   Procedure: ESOPHAGOGASTRODUODENOSCOPY (EGD);  Surgeon: Rogene Houston, MD;  [facility];  Service: Endoscopy;  Laterality: N/A;  12:45   ESOPHAGOGASTRODUODENOSCOPY (EGD) WITH PROPOFOL N/A 01/05/2022   Procedure: ESOPHAGOGASTRODUODENOSCOPY (EGD) WITH PROPOFOL;  Surgeon: [name], MD;  [facility];  Service: Gastroenterology;  Laterality: N/A;  850   POLYPECTOMY  02/17/2022   Procedure: POLYPECTOMY INTESTINAL;  Surgeon: [name], MD;  [facility];  Service: Gastroenterology;;     reports that he has never smoked. He has never used smokeless tobacco. He reports that he does not drink alcohol and does not use drugs.  No Known Allergies  Family History  Problem Relation Age of Onset   Emphysema Mother     Prior to Admission medications   Medication Sig Start Date End Date Taking? Authorizing Provider  albuterol (VENTOLIN HFA) 108 (90 Base) MCG/ACT inhaler Inhale 1-2 puffs into the lungs  every 6 (six) hours as needed. 03/25/21  Yes Parrett, Tammy S, NP  atorvastatin (LIPITOR) 10 MG tablet Take 10 mg by mouth daily. Patient not taking: Reported on 03/13/2022    [provider]  clopidogrel (PLAVIX) 75 MG tablet Take 75 mg by mouth  daily. Patient not taking: Reported on 03/13/2022    [provider]  insulin glargine (LANTUS) 100 UNIT/ML injection Inject 20 Units into the skin daily as needed (if blood glucose is over 130). Patient not taking: Reported on 03/13/2022    [provider]  levothyroxine (SYNTHROID) 50 MCG tablet Take 50 mcg by mouth daily before breakfast. Patient not taking: Reported on 03/13/2022    [provider]  losartan (COZAAR) 100 MG tablet Take 100 mg by mouth daily. Patient not taking: Reported on 03/13/2022 10/25/21   [provider]  oxyCODONE-acetaminophen (PERCOCET/ROXICET) 5-325 MG tablet Take 1 tablet by mouth 3 (three) times daily as needed for pain. Patient not taking: Reported on 03/13/2022 11/29/21   [provider]    Physical Exam: Vitals:   03/13/22 1530 03/13/22 1600 03/13/22 1630 03/13/22 1700  BP:  118/73 111/70 117/73  Pulse: (!) 109 (!) 103 (!) 106 (!) 104  Resp: (!) 26 (!) '24 16 20  '$ Temp:      TempSrc:      SpO2: 91% 91% 92% 97%  Weight:      Height:        Constitutional: NAD, calm, comfortable Vitals:   03/13/22 1530 03/13/22 1600 03/13/22 1630 03/13/22 1700  BP:  118/73 111/70 117/73  Pulse: (!) 109 (!) 103 (!) 106 (!) 104  Resp: (!) 26 (!) '24 16 20  '$ Temp:      TempSrc:      SpO2: 91% 91% 92% 97%  Weight:      Height:       Eyes: PERRL, lids and conjunctivae normal ENMT: Mucous membranes are moist. Posterior pharynx clear of any exudate or lesions.Normal dentition.  Neck: normal, supple, no masses, no thyromegaly Respiratory: clear to auscultation bilaterally, no wheezing, no crackles. Normal respiratory effort. No accessory muscle use.  Cardiovascular: Regular rate and rhythm, no murmurs / rubs / gallops. No extremity edema. 2+ pedal pulses. No carotid bruits.  Abdomen: no tenderness, no masses palpated. No hepatosplenomegaly. Bowel sounds positive.  Musculoskeletal: no clubbing / cyanosis. No joint deformity upper  and lower extremities. Good ROM, no contractures. Normal muscle tone.  Skin: no rashes, lesions, ulcers. No induration Neurologic: CN 2-12 grossly intact. Sensation intact, DTR normal. Strength 5/5 in all 4.  Psychiatric: Normal judgment and insight. Alert and oriented x 3. Normal mood.    Labs on Admission: I have personally reviewed following labs and imaging studies  CBC: Recent Labs  Lab 03/13/22 1508  WBC 15.2*  HGB 13.5  HCT 41.5  MCV 98.6  PLT 517   Basic Metabolic Panel: Recent Labs  Lab 03/13/22 1508  NA 137  K 5.1  CL 98  CO2 27  GLUCOSE 140*  BUN 18  CREATININE 0.98  CALCIUM 9.0   GFR: Estimated Creatinine Clearance: 62.1 mL/min (by C-G formula based on SCr of 0.98 mg/dL). Liver Function Tests: No results for input(s): "AST", "ALT", "ALKPHOS", "BILITOT", "PROT", "ALBUMIN" in the last 168 hours. No results for input(s): "LIPASE", "AMYLASE" in the last 168 hours. No results for input(s): "AMMONIA" in the last 168 hours. Coagulation Profile: No results for input(s): "INR", "PROTIME" in the last 168 hours. Cardiac Enzymes: No results  for input(s): "CKTOTAL", "CKMB", "CKMBINDEX", "TROPONINI" in the last 168 hours. BNP (last 3 results) Recent Labs    12/23/21 1226  PROBNP 47.0   HbA1C: No results for input(s): "HGBA1C" in the last 72 hours. CBG: No results for input(s): "GLUCAP" in the last 168 hours. Lipid Profile: No results for input(s): "CHOL", "HDL", "LDLCALC", "TRIG", "CHOLHDL", "LDLDIRECT" in the last 72 hours. Thyroid Function Tests: No results for input(s): "TSH", "T4TOTAL", "FREET4", "T3FREE", "THYROIDAB" in the last 72 hours. Anemia Panel: No results for input(s): "VITAMINB12", "FOLATE", "FERRITIN", "TIBC", "IRON", "RETICCTPCT" in the last 72 hours. Urine analysis: No results found for: "COLORURINE", "APPEARANCEUR", "LABSPEC", "PHURINE", "GLUCOSEU", "HGBUR", "BILIRUBINUR", "KETONESUR", "PROTEINUR", "UROBILINOGEN", "NITRITE",  "LEUKOCYTESUR"  Radiological Exams on Admission: CT Chest Wo Contrast  Result Date: 03/13/2022 CLINICAL DATA:  Interstitial lung disease and pulmonary fibrosis. Hypoxia. Evaluate for aspiration pneumonia. EXAM: CT CHEST WITHOUT CONTRAST TECHNIQUE: Multidetector CT imaging of the chest was performed following the standard protocol without IV contrast. RADIATION DOSE REDUCTION: This exam was performed according to the departmental dose-optimization program which includes automated exposure control, adjustment of the mA and/or kV according to patient size and/or use of iterative reconstruction technique. COMPARISON:  High-resolution chest CT 12/29/2021 FINDINGS: Cardiovascular: No significant vascular findings. Normal heart size. No pericardial effusion. There are atherosclerotic calcifications of the aorta and coronary arteries. Mediastinum/Nodes: There is moderate amount of diffuse pneumomediastinum which has increased from prior. Visualized esophagus and thyroid gland are within normal limits. There is an enlarged subcarinal lymph node measuring 16 mm short axis which appears unchanged. Additional nonenlarged mediastinal lymph nodes appears similar to prior. Lungs/Pleura: There is a new small right pneumothorax (approximately 10%). There is no mediastinal shift. Peripheral interstitial and fibrotic changes are again noted similar to the prior study. There is some new patchy airspace disease in the bilateral lower lobes peripherally. There is no pleural effusion. The trachea and central airways appear patent. Upper Abdomen: No acute abnormality. Musculoskeletal: No chest wall mass or suspicious bone lesions identified. Degenerative changes affect the spine. IMPRESSION: 1. New small 10% right-sided pneumothorax. 2. Moderate pneumomediastinum has increased from prior. 3. New small amount of patchy airspace disease in the bilateral lower lobes worrisome for infection. 4. Otherwise stable fibrotic interstitial lung  disease. Electronically Signed   By: Ronney Asters M.D.   On: 03/13/2022 16:31   CT Head Wo Contrast  Result Date: 03/13/2022 CLINICAL DATA:  Head trauma, minor (Age >= 65y) fall head injury hematoma left parietal scalp EXAM: CT HEAD WITHOUT CONTRAST TECHNIQUE: Contiguous axial images were obtained from the base of the skull through the vertex without intravenous contrast. RADIATION DOSE REDUCTION: This exam was performed according to the departmental dose-optimization program which includes automated exposure control, adjustment of the mA and/or kV according to patient size and/or use of iterative reconstruction technique. COMPARISON:  None Available. FINDINGS: Brain: There is no acute intracranial hemorrhage, mass effect, or edema. Gray-white differentiation is preserved. There is no extra-axial fluid collection. Prominence of the ventricles and sulci reflects minor parenchymal volume loss. Patchy hypoattenuation in the supratentorial white matter is nonspecific but may reflect mild chronic microvascular ischemic changes. Vascular: There is atherosclerotic calcification at the skull base. Skull: Calvarium is unremarkable. Sinuses/Orbits: No acute finding. Other: Mastoid air cells are clear. Left frontal scalp soft tissue swelling. IMPRESSION: No evidence of acute intracranial injury. Electronically Signed   By: Macy Mis M.D.   On: 03/13/2022 16:26    EKG: Independently reviewed.  Sinus tachycardia, left intrafascicular block.  Assessment/Plan Principal Problem:   Aspiration pneumonia (HCC) Active Problems:   ILD (interstitial lung disease) ([place])   Hypertension associated with diabetes ([place])   Sepsis ([place])   Pneumothorax   Fall at home, initial encounter   Sepsis secondary to bilateral aspiration pneumonia/dysphagia: Per wife, he has history of dysphagia.  However he does not follow recommended diet.  He is noncompliant at baseline and does not take medications either.  Currently he meets  criteria for sepsis based on tachycardia, tachypnea and leukocytosis.  I will check lactic acid and procalcitonin.  I will start him on Rocephin and Zithromax.  Check sputum culture, blood culture, urine antigen for Legionella and streptococci.  Patient has been given Zosyn, blood cultures have not been drawn in the ED.  Interstitial lung disease and now with small right-sided pneumothorax: As mentioned above, case was discussed by ED physician with Dr. Melvyn Novas who recommended conservative/oxygen therapy and for that reason, patient was started on 6 L oxygen.  Monitor closely.  Fall resulting in left scalp bruise, no laceration: Likely due to overall condition, patient looks very weak and deconditioned.  We will consult PT OT.  History of type 2 diabetes mellitus: Patient noncompliant.  I will start him on SSI and check hemoglobin A1c.  Acquired hypothyroidism: He is supposed to be on thyroid medication but he does not take this.  I will check TSH.  Essential hypertension: Does not take medications, he supposed to take Cozaar.  Blood pressure controlled at the moment so we will just monitor off of medications for now.  DVT prophylaxis: enoxaparin (LOVENOX) injection 40 mg Start: 03/13/22 1715 Code Status: DNR Family Communication: Patient's wife and son present at bedside.  Plan of care discussed with patient in length and he verbalized understanding and agreed with it. Disposition Plan: Likely home in next 2 to 3 days Consults called: ED physician discussed with Dr. Melvyn Novas.  He is not officially consulted.  Darliss Cheney MD [facility]  *Please note that this is a verbal dictation therefore any spelling or grammatical errors are due to the "[facility]" system interpretation.  Please page via [place] and do not message via secure chat for urgent patient care matters. Secure chat can be used for non urgent patient care matters. 03/13/2022, 5:29 PM  To contact the attending provider  between 7A-7P or the covering provider during after hours 7P-7A, please log into the web site [email or web address]

## 2022-03-13 NOTE — ED Notes (Signed)
Provided skin care to patient on left elbow. Present on admission. Cleaned wound with saline. Dressed with Vaseline gauze and curlex.

## 2022-03-13 NOTE — ED Triage Notes (Signed)
Pt fell while in the bathtub; pt has hematoma to left side of head and bandage to left elbow  Pt denies any loc

## 2022-03-14 ENCOUNTER — Observation Stay (HOSPITAL_COMMUNITY): Payer: Medicare Other

## 2022-03-14 ENCOUNTER — Telehealth: Payer: Self-pay | Admitting: Internal Medicine

## 2022-03-14 DIAGNOSIS — Z91199 Patient's noncompliance with other medical treatment and regimen due to unspecified reason: Secondary | ICD-10-CM | POA: Diagnosis not present

## 2022-03-14 DIAGNOSIS — Y92009 Unspecified place in unspecified non-institutional (private) residence as the place of occurrence of the external cause: Secondary | ICD-10-CM | POA: Diagnosis not present

## 2022-03-14 DIAGNOSIS — J189 Pneumonia, unspecified organism: Secondary | ICD-10-CM

## 2022-03-14 DIAGNOSIS — I10 Essential (primary) hypertension: Secondary | ICD-10-CM | POA: Diagnosis not present

## 2022-03-14 DIAGNOSIS — K219 Gastro-esophageal reflux disease without esophagitis: Secondary | ICD-10-CM | POA: Diagnosis present

## 2022-03-14 DIAGNOSIS — E7800 Pure hypercholesterolemia, unspecified: Secondary | ICD-10-CM | POA: Diagnosis present

## 2022-03-14 DIAGNOSIS — E039 Hypothyroidism, unspecified: Secondary | ICD-10-CM | POA: Diagnosis present

## 2022-03-14 DIAGNOSIS — I4891 Unspecified atrial fibrillation: Secondary | ICD-10-CM | POA: Diagnosis present

## 2022-03-14 DIAGNOSIS — E1169 Type 2 diabetes mellitus with other specified complication: Secondary | ICD-10-CM | POA: Diagnosis present

## 2022-03-14 DIAGNOSIS — Z20822 Contact with and (suspected) exposure to covid-19: Secondary | ICD-10-CM | POA: Diagnosis present

## 2022-03-14 DIAGNOSIS — A419 Sepsis, unspecified organism: Secondary | ICD-10-CM

## 2022-03-14 DIAGNOSIS — E43 Unspecified severe protein-calorie malnutrition: Secondary | ICD-10-CM | POA: Diagnosis present

## 2022-03-14 DIAGNOSIS — Z7189 Other specified counseling: Secondary | ICD-10-CM | POA: Diagnosis not present

## 2022-03-14 DIAGNOSIS — E119 Type 2 diabetes mellitus without complications: Secondary | ICD-10-CM

## 2022-03-14 DIAGNOSIS — J9312 Secondary spontaneous pneumothorax: Secondary | ICD-10-CM

## 2022-03-14 DIAGNOSIS — Z515 Encounter for palliative care: Secondary | ICD-10-CM | POA: Diagnosis not present

## 2022-03-14 DIAGNOSIS — J9601 Acute respiratory failure with hypoxia: Secondary | ICD-10-CM

## 2022-03-14 DIAGNOSIS — Z23 Encounter for immunization: Secondary | ICD-10-CM | POA: Diagnosis not present

## 2022-03-14 DIAGNOSIS — R1310 Dysphagia, unspecified: Secondary | ICD-10-CM | POA: Diagnosis present

## 2022-03-14 DIAGNOSIS — J939 Pneumothorax, unspecified: Secondary | ICD-10-CM | POA: Diagnosis not present

## 2022-03-14 DIAGNOSIS — Z7902 Long term (current) use of antithrombotics/antiplatelets: Secondary | ICD-10-CM | POA: Diagnosis not present

## 2022-03-14 DIAGNOSIS — Z681 Body mass index (BMI) 19 or less, adult: Secondary | ICD-10-CM | POA: Diagnosis not present

## 2022-03-14 DIAGNOSIS — Z794 Long term (current) use of insulin: Secondary | ICD-10-CM | POA: Diagnosis not present

## 2022-03-14 DIAGNOSIS — J982 Interstitial emphysema: Secondary | ICD-10-CM | POA: Diagnosis not present

## 2022-03-14 DIAGNOSIS — J690 Pneumonitis due to inhalation of food and vomit: Secondary | ICD-10-CM | POA: Diagnosis not present

## 2022-03-14 DIAGNOSIS — J84112 Idiopathic pulmonary fibrosis: Secondary | ICD-10-CM | POA: Diagnosis present

## 2022-03-14 DIAGNOSIS — J849 Interstitial pulmonary disease, unspecified: Secondary | ICD-10-CM | POA: Diagnosis not present

## 2022-03-14 DIAGNOSIS — J8410 Pulmonary fibrosis, unspecified: Secondary | ICD-10-CM | POA: Diagnosis present

## 2022-03-14 DIAGNOSIS — J9383 Other pneumothorax: Secondary | ICD-10-CM | POA: Diagnosis present

## 2022-03-14 DIAGNOSIS — I152 Hypertension secondary to endocrine disorders: Secondary | ICD-10-CM | POA: Diagnosis present

## 2022-03-14 DIAGNOSIS — S0003XA Contusion of scalp, initial encounter: Secondary | ICD-10-CM | POA: Diagnosis present

## 2022-03-14 DIAGNOSIS — Z66 Do not resuscitate: Secondary | ICD-10-CM | POA: Diagnosis present

## 2022-03-14 DIAGNOSIS — E785 Hyperlipidemia, unspecified: Secondary | ICD-10-CM | POA: Diagnosis present

## 2022-03-14 DIAGNOSIS — Z79899 Other long term (current) drug therapy: Secondary | ICD-10-CM | POA: Diagnosis not present

## 2022-03-14 DIAGNOSIS — J90 Pleural effusion, not elsewhere classified: Secondary | ICD-10-CM | POA: Diagnosis not present

## 2022-03-14 DIAGNOSIS — W19XXXA Unspecified fall, initial encounter: Secondary | ICD-10-CM | POA: Diagnosis not present

## 2022-03-14 LAB — GLUCOSE, CAPILLARY
Glucose-Capillary: 87 mg/dL (ref 70–99)
Glucose-Capillary: 119 mg/dL — ABNORMAL HIGH (ref 70–99)
Glucose-Capillary: 156 mg/dL — ABNORMAL HIGH (ref 70–99)
Glucose-Capillary: 143 mg/dL — ABNORMAL HIGH (ref 70–99)

## 2022-03-14 LAB — PROTIME-INR
INR: 1.2 (ref 0.8–1.2)
Prothrombin Time: 15.3 seconds — ABNORMAL HIGH (ref 11.4–15.2)

## 2022-03-14 LAB — STREP PNEUMONIAE URINARY ANTIGEN: Strep Pneumo Urinary Antigen: POSITIVE — AB

## 2022-03-14 LAB — HEMOGLOBIN A1C
Hgb A1c MFr Bld: 5.5 % (ref 4.8–5.6)
Mean Plasma Glucose: 111.15 mg/dL

## 2022-03-14 LAB — PROCALCITONIN: Procalcitonin: 0.38 ng/mL

## 2022-03-14 LAB — MRSA NEXT GEN BY PCR, NASAL: MRSA by PCR Next Gen: NOT DETECTED

## 2022-03-14 LAB — CORTISOL-AM, BLOOD: Cortisol - AM: 33.9 ug/dL — ABNORMAL HIGH (ref 6.7–22.6)

## 2022-03-14 MED ORDER — CHLORHEXIDINE GLUCONATE CLOTH 2 % EX PADS
6.0000 | MEDICATED_PAD | Freq: Every day | CUTANEOUS | Status: DC
  Administered 2022-03-14 – 2022-03-16 (×3): 6 via TOPICAL

## 2022-03-14 MED ORDER — ORAL CARE MOUTH RINSE
15.0000 mL | OROMUCOSAL | Status: DC
  Administered 2022-03-14 – 2022-03-16 (×9): 15 mL via OROMUCOSAL

## 2022-03-14 MED ORDER — IPRATROPIUM-ALBUTEROL 0.5-2.5 (3) MG/3ML IN SOLN
3.0000 mL | Freq: Four times a day (QID) | RESPIRATORY_TRACT | Status: DC
  Administered 2022-03-14 – 2022-03-15 (×6): 3 mL via RESPIRATORY_TRACT
  Filled 2022-03-14 (×6): qty 3

## 2022-03-14 MED ORDER — ORAL CARE MOUTH RINSE: 15.0000 mL | OROMUCOSAL | Status: DC | PRN

## 2022-03-14 MED ORDER — METHYLPREDNISOLONE SODIUM SUCC 125 MG IJ SOLR
125.0000 mg | Freq: Once | INTRAMUSCULAR | Status: AC
  Administered 2022-03-14: 125 mg via INTRAVENOUS
  Filled 2022-03-14: qty 2

## 2022-03-14 MED ORDER — PANTOPRAZOLE SODIUM 40 MG PO TBEC
40.0000 mg | DELAYED_RELEASE_TABLET | Freq: Two times a day (BID) | ORAL | Status: DC
  Filled 2022-03-14: qty 1

## 2022-03-14 MED ORDER — METHYLPREDNISOLONE SODIUM SUCC 125 MG IJ SOLR
60.0000 mg | Freq: Two times a day (BID) | INTRAMUSCULAR | Status: DC
  Administered 2022-03-14 – 2022-03-16 (×4): 60 mg via INTRAVENOUS
  Filled 2022-03-14 (×4): qty 2

## 2022-03-14 NOTE — Consult Note (Signed)
NAME:  Dylan Shea, MRN:  [id number], DOB:  1950-09-15, LOS: 0 ADMISSION DATE:  03/13/2022, CONSULTATION DATE:  9/12 REFERRING MD:  Dylan Shea/[facility], CHIEF COMPLAINT:  PF/ ptx   History of Present Illness:  36 yowm never smoker  with medical history significant of interstitial lung disease but not oxygen dependent, hypertension, diabetes, hyperlipidemia, GERD, dysphagia presented to ED with a complaint of fall and shortness of breath.  Apparently patient was in his bathroom 9/11 when he felt lightheaded and he fell and hit his head to the ground.  There is some bruise on his left head but no laceration.  Patient did not lose consciousness.  He denied having any chest pain, palpitation, headache, fever, chills, sweating, cough, any abdominal pain, any problem with urination or with bowel movements however he had been feeling short of breath for about a month  progressed over the  last week PTA.     Previous dysphagia w/u at [facility] Feeding tube as only option per fm and he refused.    ED Course: Upon arrival to ED, he was tachycardic, tachypneic and has leukocytosis.  CT head unremarkable.  CT chest showed new small 10% right-sided pneumothorax and bilateral patchy opacities in the bases.  Patient was diagnosed with possible aspiration pneumonia.  He received Zosyn in the ED.  ED physician discussed with Dr. Melvyn Novas of pulmonology about his pneumothorax and he recommended conservative/oxygen therapy at the moment and no chest tube.  Hospitalist were consulted for admission.  COVID has been tested at negative      Scheduled Meds:  Chlorhexidine Gluconate Cloth  6 each Topical Daily   enoxaparin (LOVENOX) injection  40 mg Subcutaneous Q24H   influenza vaccine adjuvanted  0.5 mL Intramuscular Tomorrow-1000   insulin aspart  0-5 Units Subcutaneous QHS   insulin aspart  0-9 Units Subcutaneous TID WC   ipratropium-albuterol  3 mL Nebulization Q6H   levothyroxine  50 mcg Oral QAC breakfast    methylPREDNISolone (SOLU-MEDROL) injection  60 mg Intravenous Q12H   mouth rinse  15 mL Mouth Rinse 4 times per day   pantoprazole  40 mg Oral BID AC   pneumococcal 20-valent conjugate vaccine  0.5 mL Intramuscular Tomorrow-1000   Continuous Infusions:  azithromycin 500 mg (03/13/22 1840)   cefTRIAXone (ROCEPHIN)  IV 2 g (03/13/22 2218)   PRN Meds:.acetaminophen **OR** acetaminophen, HYDROmorphone (DILAUDID) injection, ondansetron **OR** ondansetron (ZOFRAN) IV, mouth rinse    Significant Hospital Events: Including procedures, antibiotic start and stop dates in addition to other pertinent events     Interim History / Subjective:  Changed to [name] for desats and to help with absorption of PTX   Objective   Blood pressure (!) 104/54, pulse 81, temperature (!) 96.9 F (36.1 C), temperature source Axillary, resp. rate 15, height '6\' 1"'$  (1.854 m), weight 57.5 kg, SpO2 99 %.    FiO2 (%):  [100 %] 100 %   Intake/Output Summary (Last 24 hours) at 03/14/2022 1348 Last data filed at 03/14/2022 0400 Gross per 24 hour  Intake 439.92 ml  Output 200 ml  Net 239.92 ml   Filed Weights   03/13/22 1449 03/13/22 1823  Weight: 63.5 kg 57.5 kg    Examination:  Tmax:  99.8 General appearance:    very frail acutely ill appearing cachectic wm nad at rest  At Rest 02 sats  99% on NRM  No jvd Neck supple Lungs with coarse insp crackles  bilaterally RRR no s3 or or sign murmur Abd soft  with nl  excursion  Extr warm with no edema or clubbing noted Neuro  Sensorium appears intact,  no apparent motor deficits    I personally reviewed images and agree with radiology impression as follows:  CXR:   portable 9/12   Small right apical pneumothorax. Pneumomediastinum. Small right pleural effusion.   Chronic interstitial lung disease with pulmonary fibrosis.  Assessment & Plan:  1) small R apical PTX superimposed on severe ILD, not prev 02 dep and now 02 dep resp failure >>> rx 100% NRM since this  will help with absortion by lowering the partial pressure of venous Nitrogen and increasing the partial pressure of 02 of any additional 02 thru the leak into the pleural space.  R chest tube could be considered for palliative purposes but so could anagesics give the endstage appearance of this pt who has declined a feeding tube which is a lot less invasive relatively to the benefits although certainly now a QOL issue > discussed palliative care consult with Dr Dylan Shea  Nothing else to offer at this time, very limited options reviewed with wife/daughter at bedside.  Christinia Gully, MD Pulmonary and [place] [phone]   After 7:00 pm call Elink  [phone]   Best Practice (right click and "Reselect all SmartList Selections" daily)   Per [facility]   Labs   CBC: Recent Labs  Lab 03/13/22 1508 03/13/22 2015  WBC 15.2* 17.5*  HGB 13.5 12.2*  HCT 41.5 37.0*  MCV 98.6 97.9  PLT 369 400    Basic Metabolic Panel: Recent Labs  Lab 03/13/22 1508 03/13/22 2015  NA 137  --   K 5.1  --   CL 98  --   CO2 27  --   GLUCOSE 140*  --   BUN 18  --   CREATININE 0.98 0.99  CALCIUM 9.0  --   MG  --  1.8   GFR: Estimated Creatinine Clearance: 55.7 mL/min (by C-G formula based on SCr of 0.99 mg/dL). Recent Labs  Lab 03/13/22 1508 03/13/22 2015 03/13/22 2155 03/14/22 0402  PROCALCITON  --  0.28  --  0.38  WBC 15.2* 17.5*  --   --   LATICACIDVEN  --  1.5 0.9  --     Liver Function Tests: No results for input(s): "AST", "ALT", "ALKPHOS", "BILITOT", "PROT", "ALBUMIN" in the last 168 hours. No results for input(s): "LIPASE", "AMYLASE" in the last 168 hours. No results for input(s): "AMMONIA" in the last 168 hours.  ABG No results found for: "PHART", "PCO2ART", "PO2ART", "HCO3", "TCO2", "ACIDBASEDEF", "O2SAT"   Coagulation Profile: Recent Labs  Lab 03/14/22 0402  INR 1.2    Cardiac Enzymes: No results for input(s): "CKTOTAL", "CKMB",  "CKMBINDEX", "TROPONINI" in the last 168 hours.  HbA1C: Hgb A1c MFr Bld  Date/Time Value Ref Range Status  03/13/2022 08:15 PM 5.5 4.8 - 5.6 % Final    Comment:    (NOTE) Pre diabetes:          5.7%-6.4%  Diabetes:              >6.4%  Glycemic control for   <7.0% adults with diabetes   12/23/2021 12:26 PM 6.1 4.6 - 6.5 % Final    Comment:    Glycemic Control Guidelines for People with Diabetes:Non Diabetic:  <6%Goal of Therapy: <7%Additional Action Suggested:  >8%     CBG: Recent Labs  Lab 03/13/22 2150 03/14/22 0744 03/14/22 1107  GLUCAP 99 87 119*  Past Medical History:  He,  has a past medical history of Diabetes ([place]) (01/10/2017), Dyspnea, GERD (gastroesophageal reflux disease), Hypercholesteremia, Hypertension, and Pulmonary fibrosis ([place]) (07/2021).   Surgical History:   Past Surgical History:  Procedure Laterality Date   BIOPSY  01/05/2022   Procedure: BIOPSY;  Surgeon: [name], MD;  [facility];  Service: Gastroenterology;;   COLONOSCOPY     COLONOSCOPY WITH PROPOFOL N/A 02/17/2022   Procedure: COLONOSCOPY WITH PROPOFOL;  Surgeon: [name], MD;  [facility];  Service: Gastroenterology;  Laterality: N/A;  900 ASA 2   ESOPHAGEAL DILATION N/A 04/26/2017   Procedure: ESOPHAGEAL DILATION;  Surgeon: Rogene Houston, MD;  [facility];  Service: Endoscopy;  Laterality: N/A;   ESOPHAGEAL DILATION  01/05/2022   Procedure: ESOPHAGEAL DILATION;  Surgeon: [name], MD;  [facility];  Service: Gastroenterology;;   ESOPHAGOGASTRODUODENOSCOPY N/A 04/26/2017   Procedure: ESOPHAGOGASTRODUODENOSCOPY (EGD);  Surgeon: Rogene Houston, MD;  [facility];  Service: Endoscopy;  Laterality: N/A;  12:45   ESOPHAGOGASTRODUODENOSCOPY (EGD) WITH PROPOFOL N/A 01/05/2022   Procedure: ESOPHAGOGASTRODUODENOSCOPY (EGD) WITH PROPOFOL;  Surgeon: [name], MD;  [facility];  Service: Gastroenterology;  Laterality: N/A;  850   POLYPECTOMY  02/17/2022   Procedure: POLYPECTOMY INTESTINAL;  Surgeon: [name], MD;  [facility];  Service: Gastroenterology;;     Social History:   reports that he has never smoked. He has never used smokeless tobacco. He reports that he does not drink alcohol and does not use drugs.   Family History:  His family history includes Emphysema in his mother.   Allergies No Known Allergies   Home Medications  Prior to Admission medications   Medication Sig Start Date End Date Taking? Authorizing Provider  albuterol (VENTOLIN HFA) 108 (90 Base) MCG/ACT inhaler Inhale 1-2 puffs into the lungs every 6 (six) hours as needed. 03/25/21  Yes Parrett, Tammy S, NP  atorvastatin (LIPITOR) 10 MG tablet Take 10 mg by mouth daily. Patient not taking: Reported on 03/13/2022    [provider]  clopidogrel (PLAVIX) 75 MG tablet Take 75 mg by mouth daily. Patient not taking: Reported on 03/13/2022    [provider]  insulin glargine (LANTUS) 100 UNIT/ML injection Inject 20 Units into the skin daily as needed (if blood glucose is over 130). Patient not taking: Reported on 03/13/2022    [provider]  levothyroxine (SYNTHROID) 50 MCG tablet Take 50 mcg by mouth daily before breakfast. Patient not taking: Reported on 03/13/2022    [provider]  losartan (COZAAR) 100 MG tablet Take 100 mg by mouth daily. Patient not taking: Reported on 03/13/2022 10/25/21   [provider]  oxyCODONE-acetaminophen (PERCOCET/ROXICET) 5-325 MG tablet Take 1 tablet by mouth 3 (three) times daily as needed for pain. Patient not taking: Reported on 03/13/2022 11/29/21   [provider]

## 2022-03-14 NOTE — Progress Notes (Signed)
SLP Cancellation Note  Patient Details Name: Darron Stuck MRN: [id number] DOB: 09/25/1950   Cancelled treatment:       Reason Eval/Treat Not Completed: Medical issues which prohibited therapy; pt currently on non-rebreather; ST will continue efforts when pt able to participate in BSE without respiratory compromise.   Elvina Sidle, M.S., [place] 03/14/2022, 12:49 PM

## 2022-03-14 NOTE — Progress Notes (Signed)
OT Cancellation Note  Patient Details Name: Corgan Mormile MRN: [id number] DOB: 1950-08-28   Cancelled Treatment:    Reason Eval/Treat Not Completed: Other (comment);Medical issues which prohibited therapy. Pt moved to a higher level of care and will be removed from OT evaluation list. A new order will be needed when pt is appropriate. Thank you.   Raylan Hanton OT, MOT   Larey Seat 03/14/2022, 10:31 AM

## 2022-03-14 NOTE — Progress Notes (Signed)
PT Cancellation Note  Patient Details Name: Dylan Shea MRN: [id number] DOB: 11-21-1950   Cancelled Treatment:    Reason Eval/Treat Not Completed: Medical issues which prohibited therapy.  Patient transferred to a higher level of care and will need new PT consult to resume therapy when patient is medically stable.  Thank you.    10:43 AM, 03/14/22 Lonell Grandchild, MPT Physical Therapist with [facility] [phone] office [phone] mobile phone

## 2022-03-14 NOTE — Assessment & Plan Note (Addendum)
-  Patient has a history of chronic aspiration with dysphagia -No fever, stable breathing with oxygen supplementation.  Arrange home oxygen and complete therapy using oral Augmentin. -After discussion with patient decision has been made to pursuit hospice and comfort care; dysphagia 1 diet for comfort feeding allowed. -Appreciate assistance by [facility] and palliative care. -Prescription for pain medication provided to assist with air hunger and comfort.

## 2022-03-14 NOTE — Progress Notes (Addendum)
PROGRESS NOTE  Dylan Shea YDX:[id number] DOB: 1951/02/03 DOA: 03/13/2022 PCP: Neale Burly, MD  Brief History:  71 year old male with a history of pulmonary fibrosis/UIP, dysphagia with chronic aspiration, hypertension, diabetes mellitus type 2 not on medication, GERD, hyperlipidemia presenting with shortness of breath and a fall.  The patient states that he has been short of breath for the last 2 to 3 months but it has worsened in the past week.  The patient was feeling lightheaded yesterday and fell and hit his head.  There was no loss of consciousness.  He has been short of breath even before his fall.  He denied any fevers, chills, headache, nausea, vomiting, diarrhea, abdominal pain.  He does not have a significant cough. Upon arrival, the patient had low-grade temperature of 99.8 F.  He was tachycardic into 110-120.  Oxygen saturation was 89% on room air.  He was placed on 6 L.  CT of the chest showed a new small right pneumothorax 10%.  There was moderate pneumomediastinum increased from prior scans.  There is new patchy bilateral lower lobe airspace disease.  The patient was started on ceftriaxone and azithromycin.  On the morning of 03/14/2022, the patient continued to be short of breath with saturation 92-93% on 6 L.  Pulmonary consult was placed.  The patient was moved to the stepdown unit and placed on nonrebreather.     Assessment and Plan: * Aspiration pneumonia ([place]) Patient has a history of chronic aspiration with dysphagia Continue ceftriaxone and azithromycin Speech therapy evaluation once the patient is stabilized  Acute respiratory failure with hypoxia ([place]) Presented with hypoxia 89% on room air and tachypnea Secondary to pneumonia pneumothorax Placed on nonrebreather Moved to stepdown unit [place] as tolerated  Pneumomediastinum ([place]) Secondary to the patient's pulmonary fibrosis Pulmonary consulted Place 100% nonrebreather  Fall at home, initial  encounter PT evaluation once patient is stabilized  Pneumothorax 10% pneumothorax noted on CT chest Chest x-ray Pulmonary consulted Placed on 100% nonrebreather  Sepsis ([place]) Presented with leukocytosis, tachycardia, and respiratory failure Secondary to pneumonia PCT 0.28 Left calcified 1.5 Judicious IV fluids  Essential hypertension Holding losartan   ILD (interstitial lung disease) ([place]) Patient has pulmonary fibrosis/UIP Recently finished a clinical trial 01/06/2022 Follows Dr. Chase Caller Not currently on active treatment  Controlled type 2 diabetes mellitus without complication, without long-term current use of insulin ([place]) Previously on insulin, but not currently on any agents presently 12/23/2021 hemoglobin A1c 6.1 Start NovoLog sliding scale as CBGs expected to be increased with steroids     Family Communication:   spouse updated 9/12  Consultants:  pulm  Code Status:  DNR  DVT Prophylaxis:  [place] Lovenox   Procedures: As Listed in Progress Note Above  Antibiotics: Ceftriaxone 9/11>> Azithro 9/11>      Subjective: Patient complains of shortness of breath.  He has some chest pain with inspiration.  He denies any nausea, vomiting, diarrhea, abdominal pain.  He denies any headache.  There is no visual disturbance.  Objective: Vitals:   03/13/22 1823 03/13/22 1940 03/14/22 0003 03/14/22 0423  BP: 113/61 101/62 117/86 126/71  Pulse: 100  93 97  Resp: '19 18 18 18  '$ Temp: 99.8 F (37.7 C) 98.3 F (36.8 C) 98 F (36.7 C) 97.8 F (36.6 C)  TempSrc: Oral Oral Oral Oral  SpO2: 100% 100% 92% 94%  Weight: 57.5 kg     Height: '6\' 1"'$  (1.854 m)  Intake/Output Summary (Last 24 hours) at 03/14/2022 0837 Last data filed at 03/14/2022 0400 Gross per 24 hour  Intake 439.92 ml  Output 200 ml  Net 239.92 ml   Weight change:  Exam:  General:  Pt is alert, follows commands appropriately, not in acute distress HEENT: No icterus, No thrush, No neck mass,  [place]/AT Cardiovascular: RRR, S1/S2, no rubs, no gallops Respiratory: Bibasilar wheeze.  Bilateral rales. Abdomen: Soft/+BS, non tender, non distended, no guarding Extremities: No edema, No lymphangitis, No petechiae, No rashes, no synovitis   Data Reviewed: I have personally reviewed following labs and imaging studies Basic Metabolic Panel: Recent Labs  Lab 03/13/22 1508 03/13/22 2015  NA 137  --   K 5.1  --   CL 98  --   CO2 27  --   GLUCOSE 140*  --   BUN 18  --   CREATININE 0.98 0.99  CALCIUM 9.0  --   MG  --  1.8   Liver Function Tests: No results for input(s): "AST", "ALT", "ALKPHOS", "BILITOT", "PROT", "ALBUMIN" in the last 168 hours. No results for input(s): "LIPASE", "AMYLASE" in the last 168 hours. No results for input(s): "AMMONIA" in the last 168 hours. Coagulation Profile: Recent Labs  Lab 03/14/22 0402  INR 1.2   CBC: Recent Labs  Lab 03/13/22 1508 03/13/22 2015  WBC 15.2* 17.5*  HGB 13.5 12.2*  HCT 41.5 37.0*  MCV 98.6 97.9  PLT 369 330   Cardiac Enzymes: No results for input(s): "CKTOTAL", "CKMB", "CKMBINDEX", "TROPONINI" in the last 168 hours. BNP: Invalid input(s): "POCBNP" CBG: Recent Labs  Lab 03/13/22 2150 03/14/22 0744  GLUCAP 99 87   HbA1C: No results for input(s): "HGBA1C" in the last 72 hours. Urine analysis: No results found for: "COLORURINE", "APPEARANCEUR", "LABSPEC", "PHURINE", "GLUCOSEU", "HGBUR", "BILIRUBINUR", "KETONESUR", "PROTEINUR", "UROBILINOGEN", "NITRITE", "LEUKOCYTESUR" Sepsis Labs: '@LABRCNTIP'$ (procalcitonin:4,lacticidven:4) ) Recent Results (from the past 240 hour(s))  SARS Coronavirus 2 by RT PCR (hospital order, performed in [facility] hospital lab) *cepheid single result test* Anterior Nasal Swab     Status: None   Collection Time: 03/13/22  3:10 PM   Specimen: Anterior Nasal Swab  Result Value Ref Range Status   SARS Coronavirus 2 by RT PCR NEGATIVE NEGATIVE Final    Comment: (NOTE) SARS-CoV-2 target  nucleic acids are NOT DETECTED.  The SARS-CoV-2 RNA is generally detectable in upper and lower respiratory specimens during the acute phase of infection. The lowest concentration of SARS-CoV-2 viral copies this assay can detect is 250 copies / mL. A negative result does not preclude SARS-CoV-2 infection and should not be used as the sole basis for treatment or other patient management decisions.  A negative result may occur with improper specimen collection / handling, submission of specimen other than nasopharyngeal swab, presence of viral mutation(s) within the areas targeted by this assay, and inadequate number of viral copies (<250 copies / mL). A negative result must be combined with clinical observations, patient history, and epidemiological information.  Fact Sheet for Patients:   https://www.patel.info/  Fact Sheet for Healthcare Providers: https://hall.com/  This test is not yet approved or  cleared by the [place] FDA and has been authorized for detection and/or diagnosis of SARS-CoV-2 by FDA under an Emergency Use Authorization (EUA).  This EUA will remain in effect (meaning this test can be used) for the duration of the COVID-19 declaration under Section 564(b)(1) of the Act, 21 U.S.C. section 360bbb-3(b)(1), unless the authorization is terminated or revoked sooner.  Performed at  [facility], [address], [place], [place] [address]   Culture, blood (single) w Reflex to ID Panel     Status: None (Preliminary result)   Collection Time: 03/13/22  8:15 PM   Specimen: BLOOD RIGHT HAND  Result Value Ref Range Status   Specimen Description BLOOD RIGHT HAND  Final   Special Requests   Final    BOTTLES DRAWN AEROBIC ONLY Blood Culture adequate volume   Culture   Final    NO GROWTH < 12 HOURS Performed at [facility], [address] [place], [place] [address]    Report Status PENDING  Incomplete  Culture, blood (Routine X  2) w Reflex to ID Panel     Status: None (Preliminary result)   Collection Time: 03/13/22  8:15 PM   Specimen: BLOOD RIGHT ARM  Result Value Ref Range Status   Specimen Description BLOOD RIGHT ARM  Final   Special Requests   Final    BOTTLES DRAWN AEROBIC AND ANAEROBIC Blood Culture adequate volume   Culture   Final    NO GROWTH < 12 HOURS Performed at [facility], [address] [place], [place] [address]    Report Status PENDING  Incomplete  Culture, blood (Routine X 2) w Reflex to ID Panel     Status: None (Preliminary result)   Collection Time: 03/13/22  8:16 PM   Specimen: BLOOD LEFT WRIST  Result Value Ref Range Status   Specimen Description BLOOD LEFT WRIST  Final   Special Requests   Final    BOTTLES DRAWN AEROBIC AND ANAEROBIC Blood Culture adequate volume   Culture   Final    NO GROWTH < 12 HOURS Performed at [facility], [address] [place], [place] [address]    Report Status PENDING  Incomplete     Scheduled Meds:  enoxaparin (LOVENOX) injection  40 mg Subcutaneous Q24H   influenza vaccine adjuvanted  0.5 mL Intramuscular Tomorrow-1000   insulin aspart  0-5 Units Subcutaneous QHS   insulin aspart  0-9 Units Subcutaneous TID WC   ipratropium-albuterol  3 mL Nebulization Q6H   levothyroxine  50 mcg Oral QAC breakfast   methylPREDNISolone (SOLU-MEDROL) injection  125 mg Intravenous Once   methylPREDNISolone (SOLU-MEDROL) injection  60 mg Intravenous Q12H   pantoprazole  40 mg Oral BID AC   pneumococcal 20-valent conjugate vaccine  0.5 mL Intramuscular Tomorrow-1000   Continuous Infusions:  azithromycin 500 mg (03/13/22 1840)   cefTRIAXone (ROCEPHIN)  IV 2 g (03/13/22 2218)    Procedures/Studies: CT Chest Wo Contrast  Result Date: 03/13/2022 CLINICAL DATA:  Interstitial lung disease and pulmonary fibrosis. Hypoxia. Evaluate for aspiration pneumonia. EXAM: CT CHEST WITHOUT CONTRAST TECHNIQUE: Multidetector CT imaging of the chest was performed following the  standard protocol without IV contrast. RADIATION DOSE REDUCTION: This exam was performed according to the departmental dose-optimization program which includes automated exposure control, adjustment of the mA and/or kV according to patient size and/or use of iterative reconstruction technique. COMPARISON:  High-resolution chest CT 12/29/2021 FINDINGS: Cardiovascular: No significant vascular findings. Normal heart size. No pericardial effusion. There are atherosclerotic calcifications of the aorta and coronary arteries. Mediastinum/Nodes: There is moderate amount of diffuse pneumomediastinum which has increased from prior. Visualized esophagus and thyroid gland are within normal limits. There is an enlarged subcarinal lymph node measuring 16 mm short axis which appears unchanged. Additional nonenlarged mediastinal lymph nodes appears similar to prior. Lungs/Pleura: There is a new small right pneumothorax (approximately 10%). There is no mediastinal shift.  Peripheral interstitial and fibrotic changes are again noted similar to the prior study. There is some new patchy airspace disease in the bilateral lower lobes peripherally. There is no pleural effusion. The trachea and central airways appear patent. Upper Abdomen: No acute abnormality. Musculoskeletal: No chest wall mass or suspicious bone lesions identified. Degenerative changes affect the spine. IMPRESSION: 1. New small 10% right-sided pneumothorax. 2. Moderate pneumomediastinum has increased from prior. 3. New small amount of patchy airspace disease in the bilateral lower lobes worrisome for infection. 4. Otherwise stable fibrotic interstitial lung disease. Electronically Signed   By: Ronney Asters M.D.   On: 03/13/2022 16:31   CT Head Wo Contrast  Result Date: 03/13/2022 CLINICAL DATA:  Head trauma, minor (Age >= 65y) fall head injury hematoma left parietal scalp EXAM: CT HEAD WITHOUT CONTRAST TECHNIQUE: Contiguous axial images were obtained from the base  of the skull through the vertex without intravenous contrast. RADIATION DOSE REDUCTION: This exam was performed according to the departmental dose-optimization program which includes automated exposure control, adjustment of the mA and/or kV according to patient size and/or use of iterative reconstruction technique. COMPARISON:  None Available. FINDINGS: Brain: There is no acute intracranial hemorrhage, mass effect, or edema. Gray-white differentiation is preserved. There is no extra-axial fluid collection. Prominence of the ventricles and sulci reflects minor parenchymal volume loss. Patchy hypoattenuation in the supratentorial white matter is nonspecific but may reflect mild chronic microvascular ischemic changes. Vascular: There is atherosclerotic calcification at the skull base. Skull: Calvarium is unremarkable. Sinuses/Orbits: No acute finding. Other: Mastoid air cells are clear. Left frontal scalp soft tissue swelling. IMPRESSION: No evidence of acute intracranial injury. Electronically Signed   By: Macy Mis M.D.   On: 03/13/2022 16:26   ECHOCARDIOGRAM COMPLETE  Result Date: 02/27/2022    ECHOCARDIOGRAM REPORT   Patient Name:   GARYSON STELLY Date of Exam: 02/27/2022 Medical Rec #:  [id number]        Height:       73.0 in Accession #:    [id number]       Weight:       150.0 lb Date of Birth:  1951-05-12         BSA:          1.904 m Patient Age:    34 years         BP:           109/77 mmHg Patient Gender: M                HR:           117 bpm. Exam Location:  [facility] Procedure: 2D Echo, Cardiac Doppler and Color Doppler Indications:    R06.00 Dyspnea  History:        Patient has no prior history of Echocardiogram examinations.                 Risk Factors:Hypertension, Diabetes and Dyslipidemia. Idiopathic                 pulmonary fibrosis. Dyspnea on exertion. Interstitial lung                 disease.  Sonographer:    Diamond Nickel RCS Referring Phys: [place]  1.  Left ventricular ejection fraction, by estimation, is 60 to 65%. The left ventricle has normal function. The left ventricle has no regional wall motion abnormalities. There is mild left ventricular hypertrophy. Left ventricular diastolic parameters are consistent  with Grade I diastolic dysfunction (impaired relaxation).  2. Right ventricular systolic function is normal. The right ventricular size is normal. There is mildly elevated pulmonary artery systolic pressure.  3. The mitral valve is normal in structure. No evidence of mitral valve regurgitation.  4. The aortic valve is tricuspid. Aortic valve regurgitation is not visualized. Aortic valve sclerosis is present, with no evidence of aortic valve stenosis. FINDINGS  Left Ventricle: Left ventricular ejection fraction, by estimation, is 60 to 65%. The left ventricle has normal function. The left ventricle has no regional wall motion abnormalities. The left ventricular internal cavity size was small. There is mild left ventricular hypertrophy. Left ventricular diastolic parameters are consistent with Grade I diastolic dysfunction (impaired relaxation). Right Ventricle: The right ventricular size is normal. No increase in right ventricular wall thickness. Right ventricular systolic function is normal. There is mildly elevated pulmonary artery systolic pressure. The tricuspid regurgitant velocity is 3.03  m/s, and with an assumed right atrial pressure of 3 mmHg, the estimated right ventricular systolic pressure is 93.7 mmHg. Left Atrium: Left atrial size was normal in size. Right Atrium: Right atrial size was normal in size. Pericardium: There is no evidence of pericardial effusion. Mitral Valve: The mitral valve is normal in structure. No evidence of mitral valve regurgitation. Tricuspid Valve: The tricuspid valve is normal in structure. Tricuspid valve regurgitation is mild. Aortic Valve: The aortic valve is tricuspid. Aortic valve regurgitation is not visualized.  Aortic valve sclerosis is present, with no evidence of aortic valve stenosis. Pulmonic Valve: The pulmonic valve was not well visualized. Pulmonic valve regurgitation is trivial. Aorta: The aortic root and ascending aorta are structurally normal, with no evidence of dilitation. IAS/Shunts: The interatrial septum was not well visualized.  LEFT VENTRICLE PLAX 2D LVIDd:         2.50 cm   Diastology LVIDs:         2.10 cm   LV e' medial:    6.42 cm/s LV PW:         1.30 cm   LV E/e' medial:  9.1 LV IVS:        1.30 cm   LV e' lateral:   5.77 cm/s LVOT diam:     2.30 cm   LV E/e' lateral: 10.2 LV SV:         50 LV SV Index:   26 LVOT Area:     4.15 cm  RIGHT VENTRICLE RV Basal diam:  2.80 cm RV S prime:     14.60 cm/s TAPSE (M-mode): 1.5 cm RVSP:           39.7 mmHg LEFT ATRIUM           Index       RIGHT ATRIUM           Index LA diam:      1.00 cm 0.53 cm/m  RA Pressure: 3.00 mmHg LA Vol (A2C): 14.5 ml 7.62 ml/m  RA Area:     10.50 cm LA Vol (A4C): 13.8 ml 7.25 ml/m  RA Volume:   24.40 ml  12.82 ml/m  AORTIC VALVE LVOT Vmax:   84.60 cm/s LVOT Vmean:  53.900 cm/s LVOT VTI:    0.121 m  AORTA Ao Root diam: 3.90 cm Ao Asc diam:  3.10 cm MITRAL VALVE                TRICUSPID VALVE MV Area (PHT): 4.36 cm     TR Peak grad:  36.7 mmHg MV Decel Time: 174 msec     TR Vmax:        303.00 cm/s MV E velocity: 58.70 cm/s   Estimated RAP:  3.00 mmHg MV A velocity: 125.00 cm/s  RVSP:           39.7 mmHg MV E/A ratio:  0.47                             SHUNTS                             Systemic VTI:  0.12 m                             Systemic Diam: 2.30 cm Oswaldo Milian MD Electronically signed by Oswaldo Milian MD Signature Date/Time: 02/27/2022/12:55:22 PM    Final     Orson Eva, DO  [facility]  If 7PM-7AM, please contact night-coverage [email or web address] Password [facility] 03/14/2022, 8:37 AM   LOS: 0 days

## 2022-03-14 NOTE — Assessment & Plan Note (Addendum)
-  Secondary to the patient's pulmonary fibrosis -Pulmonary consulted; no other intervention recommended -Planning to transition to full comfort care and symptomatic management only. -Patient discharged home with hospice -Continue oxygen supplementation.

## 2022-03-14 NOTE — Assessment & Plan Note (Addendum)
-  Presented with leukocytosis, tachycardia, and respiratory failure Secondary to pneumonia -PCT 0.28 -Left calcified 1.5 -Complete oral antibiotics for aspiration pneumonia and focus on comfort care.

## 2022-03-14 NOTE — Assessment & Plan Note (Addendum)
-  Previously on insulin, but not currently on any agents presently 12/23/2021 hemoglobin A1c 6.1 -Patient has been transitioned to comfort care with home hospice.

## 2022-03-14 NOTE — Assessment & Plan Note (Addendum)
-  10% pneumothorax noted on CT chest -After receiving treatment with 100% oxygen supplementation through nonrebreather patient pneumothorax is now resolved. -Continue to focus on symptomatic management and comfort care.

## 2022-03-14 NOTE — Assessment & Plan Note (Addendum)
-  Presented with hypoxia 89% on room air and tachypnea Secondary to pneumonia, pneumothorax and pneumomediastinum. -Patient in the requiring the use of nonrebreather 100% oxygen supplementation and subsequent to that up to 15 L high flow nasal cannula. -Now with resolution of pneumothorax and treatment of pneumonia provided oxygen supplementation decreased to 6-7 L at time of discharge -Patient transition to full comfort care with symptomatic management and hospice.

## 2022-03-14 NOTE — Assessment & Plan Note (Addendum)
-   Discharged home with home hospice and family care.

## 2022-03-14 NOTE — Plan of Care (Signed)

## 2022-03-14 NOTE — Hospital Course (Signed)
71 year old male with a history of pulmonary fibrosis/UIP, dysphagia with chronic aspiration, hypertension, diabetes mellitus type 2 not on medication, GERD, hyperlipidemia presenting with shortness of breath and a fall.  The patient states that he has been short of breath for the last 2 to 3 months but it has worsened in the past week.  The patient was feeling lightheaded yesterday and fell and hit his head.  There was no loss of consciousness.  He has been short of breath even before his fall.  He denied any fevers, chills, headache, nausea, vomiting, diarrhea, abdominal pain.  He does not have a significant cough. Upon arrival, the patient had low-grade temperature of 99.8 F.  He was tachycardic into 110-120.  Oxygen saturation was 89% on room air.  He was placed on 6 L.  CT of the chest showed a new small right pneumothorax 10%.  There was moderate pneumomediastinum increased from prior scans.  There is new patchy bilateral lower lobe airspace disease.  The patient was started on ceftriaxone and azithromycin.  On the morning of 03/14/2022, the patient continued to be short of breath with saturation 92-93% on 6 L.  Pulmonary consult was placed.  The patient was moved to the stepdown unit and placed on nonrebreather.

## 2022-03-14 NOTE — Assessment & Plan Note (Addendum)
-  Patient has been placed on daily extended release Cardizem and to assist with blood pressure and heart rate control (transient episode of A-fib during hospitalization notice). -No other antihypertensive agents will be provided -Patient overall transition to comfort care with symptomatic management and home hospice.

## 2022-03-14 NOTE — TOC Progression Note (Signed)
Transition of Care [facility]) - Progression Note    Patient Details  Name: Dylan Shea MRN: [id number] Date of Birth: [birth year]  Transition of Care [facility]) CM/SW Contact  Salome Arnt, [place] Phone Number: 03/14/2022, 10:07 AM  Clinical Narrative:   Transition of Care (TOC) Screening Note   Patient Details  Name: Dylan Shea Date of Birth: 1950-07-24   Transition of Care (TOC) CM/SW Contact:    Salome Arnt, [place] Phone Number: 03/14/2022, 10:07 AM    Transition of Care Department [facility]) has reviewed patient and no TOC needs have been identified at this time. We will continue to monitor patient advancement through interdisciplinary progression rounds. If new patient transition needs arise, please place a TOC consult.         Barriers to Discharge: Continued Medical Work up  Expected Discharge Plan and Services                                                 Social Determinants of Health (SDOH) Interventions    Readmission Risk Interventions     No data to display

## 2022-03-14 NOTE — Assessment & Plan Note (Addendum)
-  Patient has pulmonary fibrosis/UIP -Recently finished a clinical trial 01/06/2022 -Follows Dr. Chase Caller -Not currently on active treatment -Currently transition to comfort care with home hospice. -Continue symptomatic management. -Appreciate assistance and recommendation by pulmonology service.

## 2022-03-15 ENCOUNTER — Encounter (HOSPITAL_COMMUNITY): Payer: Self-pay | Admitting: Internal Medicine

## 2022-03-15 ENCOUNTER — Inpatient Hospital Stay (HOSPITAL_COMMUNITY): Payer: Medicare Other

## 2022-03-15 DIAGNOSIS — J690 Pneumonitis due to inhalation of food and vomit: Secondary | ICD-10-CM | POA: Diagnosis not present

## 2022-03-15 DIAGNOSIS — E119 Type 2 diabetes mellitus without complications: Secondary | ICD-10-CM | POA: Diagnosis not present

## 2022-03-15 DIAGNOSIS — Y92009 Unspecified place in unspecified non-institutional (private) residence as the place of occurrence of the external cause: Secondary | ICD-10-CM

## 2022-03-15 DIAGNOSIS — I10 Essential (primary) hypertension: Secondary | ICD-10-CM | POA: Diagnosis not present

## 2022-03-15 DIAGNOSIS — W19XXXA Unspecified fall, initial encounter: Secondary | ICD-10-CM

## 2022-03-15 DIAGNOSIS — Z7189 Other specified counseling: Secondary | ICD-10-CM

## 2022-03-15 DIAGNOSIS — J9601 Acute respiratory failure with hypoxia: Secondary | ICD-10-CM | POA: Diagnosis not present

## 2022-03-15 DIAGNOSIS — J982 Interstitial emphysema: Secondary | ICD-10-CM

## 2022-03-15 DIAGNOSIS — Z515 Encounter for palliative care: Secondary | ICD-10-CM

## 2022-03-15 LAB — CBC
HCT: 40.4 % (ref 39.0–52.0)
Hemoglobin: 13 g/dL (ref 13.0–17.0)
MCH: 32.2 pg (ref 26.0–34.0)
MCHC: 32.2 g/dL (ref 30.0–36.0)
MCV: 100 fL (ref 80.0–100.0)
Platelets: 297 10*3/uL (ref 150–400)
RBC: 4.04 MIL/uL — ABNORMAL LOW (ref 4.22–5.81)
RDW: 12.5 % (ref 11.5–15.5)
WBC: 23 10*3/uL — ABNORMAL HIGH (ref 4.0–10.5)
nRBC: 0 % (ref 0.0–0.2)

## 2022-03-15 LAB — BASIC METABOLIC PANEL
Anion gap: 6 (ref 5–15)
BUN: 26 mg/dL — ABNORMAL HIGH (ref 8–23)
CO2: 32 mmol/L (ref 22–32)
Calcium: 8.7 mg/dL — ABNORMAL LOW (ref 8.9–10.3)
Chloride: 101 mmol/L (ref 98–111)
Creatinine, Ser: 0.74 mg/dL (ref 0.61–1.24)
GFR, Estimated: >60 mL/min (ref >60)
Glucose, Bld: 167 mg/dL — ABNORMAL HIGH (ref 70–99)
Potassium: 4.1 mmol/L (ref 3.5–5.1)
Sodium: 139 mmol/L (ref 135–145)

## 2022-03-15 LAB — GLUCOSE, CAPILLARY
Glucose-Capillary: 143 mg/dL — ABNORMAL HIGH (ref 70–99)
Glucose-Capillary: 128 mg/dL — ABNORMAL HIGH (ref 70–99)
Glucose-Capillary: 125 mg/dL — ABNORMAL HIGH (ref 70–99)
Glucose-Capillary: 106 mg/dL — ABNORMAL HIGH (ref 70–99)

## 2022-03-15 LAB — LEGIONELLA PNEUMOPHILA SEROGP 1 UR AG: L. pneumophila Serogp 1 Ur Ag: NEGATIVE

## 2022-03-15 LAB — TROPONIN I (HIGH SENSITIVITY): Troponin I (High Sensitivity): 6 ng/L (ref <18)

## 2022-03-15 LAB — MAGNESIUM: Magnesium: 2.1 mg/dL (ref 1.7–2.4)

## 2022-03-15 MED ORDER — ENSURE ENLIVE PO LIQD
237.0000 mL | Freq: Two times a day (BID) | ORAL | Status: DC
  Administered 2022-03-16: 237 mL via ORAL

## 2022-03-15 MED ORDER — METOPROLOL TARTRATE 5 MG/5ML IV SOLN
2.5000 mg | Freq: Once | INTRAVENOUS | Status: AC
  Administered 2022-03-16: 2.5 mg via INTRAVENOUS
  Filled 2022-03-15: qty 5

## 2022-03-15 MED ORDER — PANTOPRAZOLE SODIUM 40 MG IV SOLR
40.0000 mg | Freq: Two times a day (BID) | INTRAVENOUS | Status: DC
  Administered 2022-03-15 – 2022-03-16 (×3): 40 mg via INTRAVENOUS
  Filled 2022-03-15 (×3): qty 10

## 2022-03-15 MED ORDER — LEVALBUTEROL HCL 0.63 MG/3ML IN NEBU
0.6300 mg | INHALATION_SOLUTION | Freq: Four times a day (QID) | RESPIRATORY_TRACT | Status: DC
  Administered 2022-03-16 (×3): 0.63 mg via RESPIRATORY_TRACT
  Filled 2022-03-15 (×3): qty 3

## 2022-03-15 NOTE — Consult Note (Signed)
Consultation Note Date: 03/15/2022   Patient Name: Dylan Shea  DOB: 08/10/1950  MRN: [id number]  Age / Sex: 71 y.o., male  PCP: Dylan Burly, MD Referring Physician: Barton Dubois, MD  Reason for Consultation: Establishing goals of care  HPI/Patient Profile: 71 y.o. 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   Clinical Assessment and Goals of Care: I have reviewed medical records including EPIC notes, labs and imaging, received report from RN, assessed the patient.  Dylan Shea is lying quietly in bed.  He appears acutely/chronically ill and very frail with temporal wasting and sunken eyes.  He is alert, making and mostly keeping eye contact.  Due to respiratory distress, orientation questions not asked.  He is agreeable for me to speak with his wife.  His wife of 78 years, Dylan Shea, is present at bedside along with his oldest brother Dylan Shea and Dylan Shea younger brother Dylan Shea.  As we are talking, son Dylan Shea arrives.  We meet at the bedside to discuss diagnosis prognosis, GOC, EOL wishes, disposition and options.  I introduced Palliative Medicine as specialized medical care for people living with serious illness. It focuses on providing relief from the symptoms and stress of a serious illness. The goal is to improve quality of life for both the patient and the family.  We discussed a brief life review of the patient.  Mr. and Mrs. Mansouri have been married for 48 years.  They have 2 children, son Dylan Shea and daughter Dylan Shea.  They share that he has lost about 40 pounds since Christmas.  They shared that they see Dylan Shea in [place] as pulmonologist.   We then focused on their current illness. The natural disease trajectory and expectations at EOL were  discussed.  Advanced directives, concepts specific to code status, artifical feeding and hydration, and rehospitalization were considered and discussed.  [name] endorses "treat the treatable, but allowing natural passing", no life support.  His wife states that she can about his wish.  We talk about PEG tube placement.  Discussed the importance of continued conversation with family and the medical providers regarding overall plan of care and treatment options, ensuring decisions are within the context of the patient's values and GOCs. Questions and concerns were addressed.  The family was encouraged to call with questions or concerns.  PMT will continue to support holistically.  Conference with attending, bedside nursing staff, transition of care team related to patient condition, needs, goals of care, disposition.   NEXT OF KIN -wife of 69 years, Dylan Shea.     SUMMARY OF RECOMMENDATIONS   At this point continue to treat the treatable but no CPR or intubation After discussions with hospitalist attending, Home with hospice care.   Code Status/Advance Care Planning: DNR  Symptom Management:  Per hospitalist/pulmonologist, no additional needs at this time.  Palliative Prophylaxis:  Oral Care and Turn Reposition  Additional Recommendations (Limitations, Scope, Preferences):  At this point continue to treat the treatable but no CPR or intubation  Psycho-social/Spiritual:  Desire for further Chaplaincy support:no Additional Recommendations: Caregiving  Support/Resources and Education on Hospice  Prognosis:  Unable to determine, based on outcomes.  Guarded at this point  Discharge Planning: To be determined, based on outcomes.      Primary Diagnoses: Present on Admission:  Aspiration pneumonia ([place])  ILD (interstitial lung disease) ([place])   I have reviewed the medical record, interviewed the patient and family, and examined the patient. The following aspects are  pertinent.  Past Medical History:  Diagnosis Date   Diabetes ([place]) 01/10/2017   Dyspnea    GERD (gastroesophageal reflux disease)    Hypercholesteremia    Hypertension    Pulmonary fibrosis ([place]) 07/2021   Social History   Socioeconomic History   Marital status: Married    Spouse name: Not on file   Number of children: Not on file   Years of education: Not on file   Highest education level: Not on file  Occupational History   Not on file  Tobacco Use   Smoking status: Never   Smokeless tobacco: Never  Vaping Use   Vaping Use: Never used  Substance and Sexual Activity   Alcohol use: No   Drug use: No   Sexual activity: Not on file  Other Topics Concern   Not on file  Social History Narrative   Not on file   Social Determinants of Health   Financial Resource Strain: Not on file  Food Insecurity: Not on file  Transportation Needs: Not on file  Physical Activity: Not on file  Stress: Not on file  Social Connections: Not on file   Family History  Problem Relation Age of Onset   Emphysema Mother    Scheduled Meds:  Chlorhexidine Gluconate Cloth  6 each Topical Daily   enoxaparin (LOVENOX) injection  40 mg Subcutaneous Q24H   influenza vaccine adjuvanted  0.5 mL Intramuscular Tomorrow-1000   insulin aspart  0-5 Units Subcutaneous QHS   insulin aspart  0-9 Units Subcutaneous TID WC   ipratropium-albuterol  3 mL Nebulization Q6H   levothyroxine  50 mcg Oral QAC breakfast   methylPREDNISolone (SOLU-MEDROL) injection  60 mg Intravenous Q12H   mouth rinse  15 mL Mouth Rinse 4 times per day   pantoprazole (PROTONIX) IV  40 mg Intravenous Q12H   pneumococcal 20-valent conjugate vaccine  0.5 mL Intramuscular Tomorrow-1000   Continuous Infusions:  azithromycin Stopped (03/14/22 1758)   cefTRIAXone (ROCEPHIN)  IV 2 g (03/14/22 2240)   PRN Meds:.acetaminophen **OR** acetaminophen, HYDROmorphone (DILAUDID) injection, ondansetron **OR** ondansetron (ZOFRAN) IV, mouth  rinse Medications Prior to Admission:  Prior to Admission medications   Medication Sig Start Date End Date Taking? Authorizing Provider  albuterol (VENTOLIN HFA) 108 (90 Base) MCG/ACT inhaler Inhale 1-2 puffs into the lungs every 6 (six) hours as needed. 03/25/21  Yes Parrett, Tammy S, NP  atorvastatin (LIPITOR) 10 MG tablet Take 10 mg by mouth daily. Patient not taking: Reported on 03/13/2022    [provider]  clopidogrel (PLAVIX) 75 MG tablet Take 75 mg by mouth daily. Patient not taking: Reported on 03/13/2022    [provider]  insulin glargine (LANTUS) 100 UNIT/ML injection Inject 20 Units into the skin daily as needed (if blood glucose is over 130). Patient not taking: Reported on 03/13/2022    [provider]  levothyroxine (SYNTHROID) 50 MCG tablet Take 50 mcg by mouth daily before breakfast. Patient  not taking: Reported on 03/13/2022    [provider]  losartan (COZAAR) 100 MG tablet Take 100 mg by mouth daily. Patient not taking: Reported on 03/13/2022 10/25/21   [provider]  oxyCODONE-acetaminophen (PERCOCET/ROXICET) 5-325 MG tablet Take 1 tablet by mouth 3 (three) times daily as needed for pain. Patient not taking: Reported on 03/13/2022 11/29/21   [provider]   No Known Allergies Review of Systems  Unable to perform ROS: Severe respiratory distress    Physical Exam Vitals and nursing note reviewed.  Constitutional:      Appearance: He is ill-appearing.  HENT:     Head:     Comments: Temporal wasting, sunken eyes Cardiovascular:     Rate and Rhythm: Normal rate.  Pulmonary:     Comments: Non re breather Musculoskeletal:     Comments: Muscle wasting  Neurological:     Mental Status: He is alert.  Psychiatric:     Comments: Calm and cooperative, not fearful     Vital Signs: BP 134/83   Pulse 81   Temp 97.7 F (36.5 C)   Resp 20   Ht '6\' 1"'$  (1.854 m)   Wt 57.5 kg   SpO2 100%   BMI 16.72 kg/m  Pain  Scale: 0-10   Pain Score: 0-No pain   SpO2: SpO2: 100 % O2 Device:SpO2: 100 % O2 Flow Rate: .O2 Flow Rate (L/min): 15 L/min  IO: Intake/output summary:  Intake/Output Summary (Last 24 hours) at 03/15/2022 1352 Last data filed at 03/15/2022 0400 Gross per 24 hour  Intake 350.76 ml  Output --  Net 350.76 ml    LBM: Last BM Date : 03/12/22 Baseline Weight: Weight: 63.5 kg Most recent weight: Weight: 57.5 kg     Palliative Assessment/Data:   Flowsheet Rows    Flowsheet Row Most Recent Value  Intake Tab   Referral Department Hospitalist  Unit at Time of Referral Intermediate Care Unit  Palliative Care Primary Diagnosis Pulmonary  Date Notified 03/14/22  Palliative Care Type New Palliative care  Reason for referral Clarify Goals of Care  Date of Admission 03/13/22  Date first seen by Palliative Care 03/15/22  # of days Palliative referral response time 1 Day(s)  # of days IP prior to Palliative referral 1  Clinical Assessment   Palliative Performance Scale Score 20%  Pain Max last 24 hours Not able to report  Pain Min Last 24 hours Not able to report  Dyspnea Max Last 24 Hours Not able to report  Dyspnea Min Last 24 hours Not able to report  Psychosocial & Spiritual Assessment   Palliative Care Outcomes        Time In: 0920 Time Out: 1035 Time Total: 75 minutes  Greater than 50%  of this time was spent counseling and coordinating care related to the above assessment and plan.  Signed by: Drue Novel, NP   Please contact Palliative Medicine Team phone at [phone] for questions and concerns.  For individual provider: See Shea Evans

## 2022-03-15 NOTE — Evaluation (Signed)
Clinical/Bedside Swallow Evaluation Patient Details  Name: Dylan Shea MRN: [id number] Date of Birth: 10/18/1950  Today's Date: 03/15/2022 Time: SLP Start Time (ACUTE ONLY): 72 SLP Stop Time (ACUTE ONLY): 8841 SLP Time Calculation (min) (ACUTE ONLY): 36 min  Past Medical History:  Past Medical History:  Diagnosis Date   Diabetes ([place]) 01/10/2017   Dyspnea    GERD (gastroesophageal reflux disease)    Hypercholesteremia    Hypertension    Pulmonary fibrosis ([place]) 07/2021   Past Surgical History:  Past Surgical History:  Procedure Laterality Date   BIOPSY  01/05/2022   Procedure: BIOPSY;  Surgeon: [name], MD;  [facility];  Service: Gastroenterology;;   COLONOSCOPY     COLONOSCOPY WITH PROPOFOL N/A 02/17/2022   Procedure: COLONOSCOPY WITH PROPOFOL;  Surgeon: [name], MD;  [facility];  Service: Gastroenterology;  Laterality: N/A;  900 ASA 2   ESOPHAGEAL DILATION N/A 04/26/2017   Procedure: ESOPHAGEAL DILATION;  Surgeon: Rogene Houston, MD;  [facility];  Service: Endoscopy;  Laterality: N/A;   ESOPHAGEAL DILATION  01/05/2022   Procedure: ESOPHAGEAL DILATION;  Surgeon: [name], MD;  [facility];  Service: Gastroenterology;;   ESOPHAGOGASTRODUODENOSCOPY N/A 04/26/2017   Procedure: ESOPHAGOGASTRODUODENOSCOPY (EGD);  Surgeon: Rogene Houston, MD;  [facility];  Service: Endoscopy;  Laterality: N/A;  12:45   ESOPHAGOGASTRODUODENOSCOPY (EGD) WITH PROPOFOL N/A 01/05/2022   Procedure: ESOPHAGOGASTRODUODENOSCOPY (EGD) WITH PROPOFOL;  Surgeon: [name], MD;  [facility];  Service: Gastroenterology;  Laterality: N/A;  850   POLYPECTOMY  02/17/2022   Procedure: POLYPECTOMY INTESTINAL;  Surgeon: [name], MD;  [facility];  Service: Gastroenterology;;   HPI:  71 y.o. male  with past medical history of interstitial lung  disease not oxygen dependent, HTN/HLD, DM, GERD, dysphagia, frailty admitted on 03/13/2022 with aspiration pneumonia, acute respiratory failure with hypoxia. Pt with long time history of dysphagia; MBSS completed in 2019 with aspiration of all liquids at that time. Pt was eating very little prior to admission and per family was consuming a puree/thin diet.    Assessment / Plan / Recommendation  Clinical Impression  Clinical swallowing evaluation completed while Pt was sitting upright in bed; Pt was provided limited trials with removal of the non-rebreather for very brief periods. Note O2 dropping into the low 80s after ~30 seconds. Pt with overt s/sx of aspiration with all limited trials of ice chips and sips of water including immediate wet coughing and multiple swallows. Pt reports to me after talking with MD, that he has chosen to go home with hospice and wants to be comfortable. This SLP spoke with Pt's son and Pt last night and he had indicated that he may want a feeding tube; Pt has decided he wants to be comfortable and understands that a feeding tube will not eliminate the risk of aspiration. He seemed very at peace with this decision. SLP will initiate a diet of D1/puree and thin liquids per request of MD and will defer further diet adustment and comfort feeds/support to Hospice care. Thank you for allowing [place] to care for this patient. Our service will sign off. SLP Visit Diagnosis: Dysphagia, unspecified (R13.10)    Aspiration Risk  Severe aspiration risk;Risk for inadequate nutrition/hydration    Diet Recommendation   D1/puree and thin liquids - per MD and defer to Hospice for further comfort feeds & recommendations  Medication Administration: Crushed with puree  Other  Recommendations Oral Care Recommendations: Oral care BID    Recommendations for follow up therapy are one component of a multi-disciplinary discharge planning process, led by the attending physician.  Recommendations may  be updated based on patient status, additional functional criteria and insurance authorization.  Follow up Recommendations No SLP follow up        Swallow Study   General HPI: 71 y.o. 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 Pt with long time history of dysphagia; MBSS completed in 2019 with aspiration of all liquids at that time. Pt was eating very little prior to admission and per family was consuming a puree/thin diet. Type of Study: Bedside Swallow Evaluation Diet Prior to this Study: NPO Temperature Spikes Noted: No Respiratory Status: Non-rebreather History of Recent Intubation: No Behavior/Cognition: Alert;Cooperative;Pleasant mood Oral Cavity Assessment: Within Functional Limits Oral Care Completed by SLP: Recent completion by staff Oral Cavity - Dentition: Adequate natural dentition Vision: Functional for self-feeding Self-Feeding Abilities: Able to feed self Patient Positioning: Upright in bed Baseline Vocal Quality: Normal Volitional Cough: Strong Volitional Swallow: Able to elicit    Oral/Motor/Sensory Function     Ice Chips Ice chips: Impaired Pharyngeal Phase Impairments: Wet Vocal Quality;Multiple swallows;Throat Clearing - Immediate;Throat Clearing - Delayed;Cough - Immediate;Cough - Delayed   Thin Liquid Thin Liquid: Impaired Presentation: Spoon Oral Phase Impairments: Poor awareness of bolus;Reduced lingual movement/coordination;Reduced labial seal Pharyngeal  Phase Impairments: Multiple swallows;Wet Vocal Quality;Throat Clearing - Immediate;Throat Clearing - Delayed;Cough - Immediate;Cough - Delayed    Nectar Thick Nectar Thick Liquid: Not tested   Honey Thick Honey Thick Liquid: Not tested   Puree Puree: Not tested   Solid     Solid: Not tested     Jakera Beaupre H. Roddie Mc, CCC-SLP Speech  Language Pathologist  Wende Bushy 03/15/2022,2:59 PM

## 2022-03-15 NOTE — Progress Notes (Cosign Needed Addendum)
Update: Patient's daughter, Cecille Rubin, called TOC from patient's son's phone and discussed hospice facilities. Choice made and referral made to [facility] for hospice services in the home.   Spoke son, Octavia Bruckner, about which hospice facility the family wants to follow patient at home. Tim to speak with family regarding hospice choice and return contact to [facility].    Reyah Streeter, Clydene Pugh, LCSW

## 2022-03-15 NOTE — Telephone Encounter (Signed)
Called patient and he would like the results of his echocardiogram. It was done last month.  Please advise sir

## 2022-03-15 NOTE — Progress Notes (Signed)
PROGRESS NOTE  Dylan Shea FUX:[id number] DOB: [birth year] DOA: 03/13/2022 PCP: Neale Burly, MD  Brief History:  71 year old male with a history of pulmonary fibrosis/UIP, dysphagia with chronic aspiration, hypertension, diabetes mellitus type 2 not on medication, GERD, hyperlipidemia presenting with shortness of breath and a fall.  The patient states that he has been short of breath for the last 2 to 3 months but it has worsened in the past week.  The patient was feeling lightheaded yesterday and fell and hit his head.  There was no loss of consciousness.  He has been short of breath even before his fall.  He denied any fevers, chills, headache, nausea, vomiting, diarrhea, abdominal pain.  He does not have a significant cough. Upon arrival, the patient had low-grade temperature of 99.8 F.  He was tachycardic into 110-120.  Oxygen saturation was 89% on room air.  He was placed on 6 L.  CT of the chest showed a new small right pneumothorax 10%.  There was moderate pneumomediastinum increased from prior scans.  There is new patchy bilateral lower lobe airspace disease.  The patient was started on ceftriaxone and azithromycin.  On the morning of 03/14/2022, the patient continued to be short of breath with saturation 92-93% on 6 L.  Pulmonary consult was placed.  The patient was moved to the stepdown unit and placed on nonrebreather.     Assessment and Plan: * Aspiration pneumonia ([place]) -Patient has a history of chronic aspiration with dysphagia -Continue ceftriaxone and azithromycin for now. -After discussion with patient decision has been made to pursued hospice and comfort care; dysphagia 1 diet for comfort feeding will be allowed. -Planning to transition to oral antibiotic regimen to finalize treatment and hopefully discharge home in a.m. -Appreciate assistance by [facility] and palliative care.  Acute respiratory failure with hypoxia (HCC) Presented with hypoxia 89% on room air and  tachypnea Secondary to pneumonia pneumothorax Placed on nonrebreather Moved to stepdown unit Wean as tolerated  Pneumomediastinum ([place]) Secondary to the patient's pulmonary fibrosis Pulmonary consulted Place 100% nonrebreather -Repeat chest x-ray in a.m.  Fall at home, initial encounter PT evaluation once patient is stabilized  Pneumothorax 10% pneumothorax noted on CT chest Chest x-ray Pulmonary consulted Placed on 100% nonrebreather -Repeat chest x-ray in a.m.  Sepsis due to undetermined organism [facility]) Presented with leukocytosis, tachycardia, and respiratory failure Secondary to pneumonia PCT 0.28 Left calcified 1.5 Judicious IV fluids  Essential hypertension -Continue holding losartan   ILD (interstitial lung disease) ([place]) -Patient has pulmonary fibrosis/UIP -Recently finished a clinical trial 01/06/2022 -Follows Dr. Chase Caller -Not currently on active treatment -Unfortunately has failed to improve and after discussing with pulmonology there is no other intervention granted to be provided. -Planning for hospice at home.  Controlled type 2 diabetes mellitus without complication, without long-term current use of insulin (HCC) Previously on insulin, but not currently on any agents presently 12/23/2021 hemoglobin A1c 6.1 Start NovoLog sliding scale as CBGs expected to be increased with steroids     Family Communication:   spouse updated 9/12  Consultants:  pulm  Code Status:  DNR  DVT Prophylaxis:  [place] Lovenox   Procedures: As Listed in Progress Note Above  Antibiotics: Ceftriaxone 9/11>> Azithro 9/11>      Subjective: No chest pain, no nausea or vomiting.  Nonrebreather in place.  Patient is afebrile  Objective: Vitals:   03/15/22 1538 03/15/22 1600 03/15/22 1700 03/15/22 1800  BP:  (!) 147/127  138/84 124/89  Pulse:  (!) 104 (!) 123 (!) 103  Resp:  17 (!) 28 (!) 23  Temp:      TempSrc:      SpO2: 98% 100% 93% 100%  Weight:      Height:         Intake/Output Summary (Last 24 hours) at 03/15/2022 1830 Last data filed at 03/15/2022 0400 Gross per 24 hour  Intake 350.76 ml  Output --  Net 350.76 ml   Weight change:  Exam: General exam: Alert, awake, oriented x 3; nonrebreather in place; expressed no major complaints.  No overnight events. Respiratory system: Positive rhonchi and bilateral rails; no using accessory muscles.  Good saturation with nonrebreather in place. Cardiovascular system:RRR. No murmurs, rubs, gallops.  No JVD. Gastrointestinal system: Abdomen is nondistended, soft and nontender. No organomegaly or masses felt. Normal bowel sounds heard. Central nervous system: Alert and oriented. No focal neurological deficits. Extremities: No cyanosis or clubbing. Skin: No petechiae. Psychiatry: Judgement and insight appear normal. Mood & affect appropriate.    Data Reviewed: I have personally reviewed following labs and imaging studies Basic Metabolic Panel: Recent Labs  Lab 03/13/22 1508 03/13/22 2015 03/15/22 0456  NA 137  --  139  K 5.1  --  4.1  CL 98  --  101  CO2 27  --  32  GLUCOSE 140*  --  167*  BUN 18  --  26*  CREATININE 0.98 0.99 0.74  CALCIUM 9.0  --  8.7*  MG  --  1.8 2.1    Coagulation Profile: Recent Labs  Lab 03/14/22 0402  INR 1.2   CBC: Recent Labs  Lab 03/13/22 1508 03/13/22 2015 03/15/22 0456  WBC 15.2* 17.5* 23.0*  HGB 13.5 12.2* 13.0  HCT 41.5 37.0* 40.4  MCV 98.6 97.9 100.0  PLT 369 330 297   CBG: Recent Labs  Lab 03/14/22 1607 03/14/22 2056 03/15/22 0722 03/15/22 1103 03/15/22 1608  GLUCAP 156* 143* 143* 128* 125*   HbA1C: Recent Labs    03/13/22 2015  HGBA1C 5.5    Sepsis Labs:  Recent Results (from the past 240 hour(s))  SARS Coronavirus 2 by RT PCR (hospital order, performed in [facility] hospital lab) *cepheid single result test* Anterior Nasal Swab     Status: None   Collection Time: 03/13/22  3:10 PM   Specimen: Anterior Nasal Swab  Result  Value Ref Range Status   SARS Coronavirus 2 by RT PCR NEGATIVE NEGATIVE Final    Comment: (NOTE) SARS-CoV-2 target nucleic acids are NOT DETECTED.  The SARS-CoV-2 RNA is generally detectable in upper and lower respiratory specimens during the acute phase of infection. The lowest concentration of SARS-CoV-2 viral copies this assay can detect is 250 copies / mL. A negative result does not preclude SARS-CoV-2 infection and should not be used as the sole basis for treatment or other patient management decisions.  A negative result may occur with improper specimen collection / handling, submission of specimen other than nasopharyngeal swab, presence of viral mutation(s) within the areas targeted by this assay, and inadequate number of viral copies (<250 copies / mL). A negative result must be combined with clinical observations, patient history, and epidemiological information.  Fact Sheet for Patients:   https://www.patel.info/  Fact Sheet for Healthcare Providers: https://hall.com/  This test is not yet approved or  cleared by the [place] FDA and has been authorized for detection and/or diagnosis of SARS-CoV-2 by FDA under an Emergency Use Authorization (  EUA).  This EUA will remain in effect (meaning this test can be used) for the duration of the COVID-19 declaration under Section 564(b)(1) of the Act, 21 U.S.C. section 360bbb-3(b)(1), unless the authorization is terminated or revoked sooner.  Performed at [facility], [address] [place], [place] [address]   Culture, blood (single) w Reflex to ID Panel     Status: None (Preliminary result)   Collection Time: 03/13/22  8:15 PM   Specimen: BLOOD RIGHT HAND  Result Value Ref Range Status   Specimen Description BLOOD RIGHT HAND  Final   Special Requests   Final    BOTTLES DRAWN AEROBIC ONLY Blood Culture adequate volume   Culture   Final    NO GROWTH 2 DAYS Performed at [facility], [address] [place], [place] [address]    Report Status PENDING  Incomplete  Culture, blood (Routine X 2) w Reflex to ID Panel     Status: None (Preliminary result)   Collection Time: 03/13/22  8:15 PM   Specimen: BLOOD RIGHT ARM  Result Value Ref Range Status   Specimen Description BLOOD RIGHT ARM  Final   Special Requests   Final    BOTTLES DRAWN AEROBIC AND ANAEROBIC Blood Culture adequate volume   Culture   Final    NO GROWTH 2 DAYS Performed at [facility], [address], [place], [place] [address]    Report Status PENDING  Incomplete  Culture, blood (Routine X 2) w Reflex to ID Panel     Status: None (Preliminary result)   Collection Time: 03/13/22  8:16 PM   Specimen: BLOOD LEFT WRIST  Result Value Ref Range Status   Specimen Description BLOOD LEFT WRIST  Final   Special Requests   Final    BOTTLES DRAWN AEROBIC AND ANAEROBIC Blood Culture adequate volume   Culture   Final    NO GROWTH 2 DAYS Performed at [facility], [address], [place], [place] [address]    Report Status PENDING  Incomplete  MRSA Next Gen by PCR, Nasal     Status: None   Collection Time: 03/14/22  9:01 AM   Specimen: Nasal Mucosa; Nasal Swab  Result Value Ref Range Status   MRSA by PCR Next Gen NOT DETECTED NOT DETECTED Final    Comment: (NOTE) The GeneXpert MRSA Assay (FDA approved for NASAL specimens only), is one component of a comprehensive MRSA colonization surveillance program. It is not intended to diagnose MRSA infection nor to guide or monitor treatment for MRSA infections. Test performance is not FDA approved in patients less than 72 years old. Performed at [facility], [address] [place], [place] [address]      Scheduled Meds:  Chlorhexidine Gluconate Cloth  6 each Topical Daily   enoxaparin (LOVENOX) injection  40 mg Subcutaneous Q24H   [START ON 03/16/2022] feeding supplement  237 mL Oral BID BM   influenza vaccine adjuvanted  0.5 mL Intramuscular Tomorrow-1000    insulin aspart  0-5 Units Subcutaneous QHS   insulin aspart  0-9 Units Subcutaneous TID WC   ipratropium-albuterol  3 mL Nebulization Q6H   levothyroxine  50 mcg Oral QAC breakfast   methylPREDNISolone (SOLU-MEDROL) injection  60 mg Intravenous Q12H   mouth rinse  15 mL Mouth Rinse 4 times per day   pantoprazole (PROTONIX) IV  40 mg Intravenous Q12H   pneumococcal 20-valent conjugate vaccine  0.5 mL Intramuscular Tomorrow-1000   Continuous Infusions:  azithromycin 500 mg (03/15/22 1641)  cefTRIAXone (ROCEPHIN)  IV 2 g (03/14/22 2240)    Procedures/Studies: DG CHEST PORT 1 VIEW  Result Date: 03/14/2022 CLINICAL DATA:  Right pneumothorax EXAM: PORTABLE CHEST 1 VIEW COMPARISON:  Previous studies including the CT chest done on 03/13/2022 FINDINGS: Cardiac size is within normal limits. Diffuse increase in interstitial markings suggest chronic interstitial lung disease and pulmonary fibrosis. There is small right apical pneumothorax measuring up to 2 cm in thickness. This finding has not changed significantly in comparison with the CT done on 03/13/2022. There are linear lucencies in mediastinum consistent with pneumomediastinum. Right lung remains well expanded. Small right pleural effusion is seen. IMPRESSION: Small right apical pneumothorax. Pneumomediastinum. Small right pleural effusion. Chronic interstitial lung disease with pulmonary fibrosis. Possibility of superimposed infection is not excluded. Electronically Signed   By: Elmer Picker M.D.   On: 03/14/2022 08:46   CT Chest Wo Contrast  Result Date: 03/13/2022 CLINICAL DATA:  Interstitial lung disease and pulmonary fibrosis. Hypoxia. Evaluate for aspiration pneumonia. EXAM: CT CHEST WITHOUT CONTRAST TECHNIQUE: Multidetector CT imaging of the chest was performed following the standard protocol without IV contrast. RADIATION DOSE REDUCTION: This exam was performed according to the departmental dose-optimization program which includes  automated exposure control, adjustment of the mA and/or kV according to patient size and/or use of iterative reconstruction technique. COMPARISON:  High-resolution chest CT 12/29/2021 FINDINGS: Cardiovascular: No significant vascular findings. Normal heart size. No pericardial effusion. There are atherosclerotic calcifications of the aorta and coronary arteries. Mediastinum/Nodes: There is moderate amount of diffuse pneumomediastinum which has increased from prior. Visualized esophagus and thyroid gland are within normal limits. There is an enlarged subcarinal lymph node measuring 16 mm short axis which appears unchanged. Additional nonenlarged mediastinal lymph nodes appears similar to prior. Lungs/Pleura: There is a new small right pneumothorax (approximately 10%). There is no mediastinal shift. Peripheral interstitial and fibrotic changes are again noted similar to the prior study. There is some new patchy airspace disease in the bilateral lower lobes peripherally. There is no pleural effusion. The trachea and central airways appear patent. Upper Abdomen: No acute abnormality. Musculoskeletal: No chest wall mass or suspicious bone lesions identified. Degenerative changes affect the spine. IMPRESSION: 1. New small 10% right-sided pneumothorax. 2. Moderate pneumomediastinum has increased from prior. 3. New small amount of patchy airspace disease in the bilateral lower lobes worrisome for infection. 4. Otherwise stable fibrotic interstitial lung disease. Electronically Signed   By: Ronney Asters M.D.   On: 03/13/2022 16:31   CT Head Wo Contrast  Result Date: 03/13/2022 CLINICAL DATA:  Head trauma, minor (Age >= 65y) fall head injury hematoma left parietal scalp EXAM: CT HEAD WITHOUT CONTRAST TECHNIQUE: Contiguous axial images were obtained from the base of the skull through the vertex without intravenous contrast. RADIATION DOSE REDUCTION: This exam was performed according to the departmental dose-optimization  program which includes automated exposure control, adjustment of the mA and/or kV according to patient size and/or use of iterative reconstruction technique. COMPARISON:  None Available. FINDINGS: Brain: There is no acute intracranial hemorrhage, mass effect, or edema. Gray-white differentiation is preserved. There is no extra-axial fluid collection. Prominence of the ventricles and sulci reflects minor parenchymal volume loss. Patchy hypoattenuation in the supratentorial white matter is nonspecific but may reflect mild chronic microvascular ischemic changes. Vascular: There is atherosclerotic calcification at the skull base. Skull: Calvarium is unremarkable. Sinuses/Orbits: No acute finding. Other: Mastoid air cells are clear. Left frontal scalp soft tissue swelling. IMPRESSION: No evidence of acute intracranial injury. Electronically  Signed   By: Macy Mis M.D.   On: 03/13/2022 16:26   ECHOCARDIOGRAM COMPLETE  Result Date: 02/27/2022    ECHOCARDIOGRAM REPORT   Patient Name:   WESLEE FOGG Date of Exam: 02/27/2022 Medical Rec #:  [id number]        Height:       73.0 in Accession #:    [id number]       Weight:       150.0 lb Date of Birth:  [birth year]         BSA:          1.904 m Patient Age:    47 years         BP:           109/77 mmHg Patient Gender: M                HR:           117 bpm. Exam Location:  [facility] Procedure: 2D Echo, Cardiac Doppler and Color Doppler Indications:    R06.00 Dyspnea  History:        Patient has no prior history of Echocardiogram examinations.                 Risk Factors:Hypertension, Diabetes and Dyslipidemia. Idiopathic                 pulmonary fibrosis. Dyspnea on exertion. Interstitial lung                 disease.  Sonographer:    Diamond Nickel RCS Referring Phys: [place]  1. Left ventricular ejection fraction, by estimation, is 60 to 65%. The left ventricle has normal function. The left ventricle has no regional wall motion  abnormalities. There is mild left ventricular hypertrophy. Left ventricular diastolic parameters are consistent with Grade I diastolic dysfunction (impaired relaxation).  2. Right ventricular systolic function is normal. The right ventricular size is normal. There is mildly elevated pulmonary artery systolic pressure.  3. The mitral valve is normal in structure. No evidence of mitral valve regurgitation.  4. The aortic valve is tricuspid. Aortic valve regurgitation is not visualized. Aortic valve sclerosis is present, with no evidence of aortic valve stenosis. FINDINGS  Left Ventricle: Left ventricular ejection fraction, by estimation, is 60 to 65%. The left ventricle has normal function. The left ventricle has no regional wall motion abnormalities. The left ventricular internal cavity size was small. There is mild left ventricular hypertrophy. Left ventricular diastolic parameters are consistent with Grade I diastolic dysfunction (impaired relaxation). Right Ventricle: The right ventricular size is normal. No increase in right ventricular wall thickness. Right ventricular systolic function is normal. There is mildly elevated pulmonary artery systolic pressure. The tricuspid regurgitant velocity is 3.03  m/s, and with an assumed right atrial pressure of 3 mmHg, the estimated right ventricular systolic pressure is 62.3 mmHg. Left Atrium: Left atrial size was normal in size. Right Atrium: Right atrial size was normal in size. Pericardium: There is no evidence of pericardial effusion. Mitral Valve: The mitral valve is normal in structure. No evidence of mitral valve regurgitation. Tricuspid Valve: The tricuspid valve is normal in structure. Tricuspid valve regurgitation is mild. Aortic Valve: The aortic valve is tricuspid. Aortic valve regurgitation is not visualized. Aortic valve sclerosis is present, with no evidence of aortic valve stenosis. Pulmonic Valve: The pulmonic valve was not well visualized. Pulmonic valve  regurgitation is trivial. Aorta: The aortic root and ascending aorta are  structurally normal, with no evidence of dilitation. IAS/Shunts: The interatrial septum was not well visualized.  LEFT VENTRICLE PLAX 2D LVIDd:         2.50 cm   Diastology LVIDs:         2.10 cm   LV e' medial:    6.42 cm/s LV PW:         1.30 cm   LV E/e' medial:  9.1 LV IVS:        1.30 cm   LV e' lateral:   5.77 cm/s LVOT diam:     2.30 cm   LV E/e' lateral: 10.2 LV SV:         50 LV SV Index:   26 LVOT Area:     4.15 cm  RIGHT VENTRICLE RV Basal diam:  2.80 cm RV S prime:     14.60 cm/s TAPSE (M-mode): 1.5 cm RVSP:           39.7 mmHg LEFT ATRIUM           Index       RIGHT ATRIUM           Index LA diam:      1.00 cm 0.53 cm/m  RA Pressure: 3.00 mmHg LA Vol (A2C): 14.5 ml 7.62 ml/m  RA Area:     10.50 cm LA Vol (A4C): 13.8 ml 7.25 ml/m  RA Volume:   24.40 ml  12.82 ml/m  AORTIC VALVE LVOT Vmax:   84.60 cm/s LVOT Vmean:  53.900 cm/s LVOT VTI:    0.121 m  AORTA Ao Root diam: 3.90 cm Ao Asc diam:  3.10 cm MITRAL VALVE                TRICUSPID VALVE MV Area (PHT): 4.36 cm     TR Peak grad:   36.7 mmHg MV Decel Time: 174 msec     TR Vmax:        303.00 cm/s MV E velocity: 58.70 cm/s   Estimated RAP:  3.00 mmHg MV A velocity: 125.00 cm/s  RVSP:           39.7 mmHg MV E/A ratio:  0.47                             SHUNTS                             Systemic VTI:  0.12 m                             Systemic Diam: 2.30 cm Oswaldo Milian MD Electronically signed by Oswaldo Milian MD Signature Date/Time: 02/27/2022/12:55:22 PM    Final     Barton Dubois, MD [facility]  If 7PM-7AM, please contact night-coverage [email or web address] Password [email or web address] 03/15/2022, 6:30 PM   LOS: 1 day

## 2022-03-16 ENCOUNTER — Inpatient Hospital Stay (HOSPITAL_COMMUNITY): Payer: Medicare Other

## 2022-03-16 DIAGNOSIS — E119 Type 2 diabetes mellitus without complications: Secondary | ICD-10-CM | POA: Diagnosis not present

## 2022-03-16 DIAGNOSIS — J690 Pneumonitis due to inhalation of food and vomit: Secondary | ICD-10-CM | POA: Diagnosis not present

## 2022-03-16 DIAGNOSIS — J9312 Secondary spontaneous pneumothorax: Secondary | ICD-10-CM | POA: Diagnosis not present

## 2022-03-16 DIAGNOSIS — J9601 Acute respiratory failure with hypoxia: Secondary | ICD-10-CM | POA: Diagnosis not present

## 2022-03-16 DIAGNOSIS — I10 Essential (primary) hypertension: Secondary | ICD-10-CM | POA: Diagnosis not present

## 2022-03-16 DIAGNOSIS — J982 Interstitial emphysema: Secondary | ICD-10-CM | POA: Diagnosis not present

## 2022-03-16 DIAGNOSIS — J8410 Pulmonary fibrosis, unspecified: Secondary | ICD-10-CM

## 2022-03-16 LAB — BLOOD GAS, VENOUS
Acid-Base Excess: 5.3 mmol/L — ABNORMAL HIGH (ref 0.0–2.0)
Bicarbonate: 34 mmol/L — ABNORMAL HIGH (ref 20.0–28.0)
O2 Saturation: 87.9 %
Patient temperature: 37
pCO2, Ven: 69 mmHg — ABNORMAL HIGH (ref 44–60)
pH, Ven: 7.3 (ref 7.25–7.43)
pO2, Ven: 57 mmHg — ABNORMAL HIGH (ref 32–45)

## 2022-03-16 LAB — GLUCOSE, CAPILLARY
Glucose-Capillary: 124 mg/dL — ABNORMAL HIGH (ref 70–99)
Glucose-Capillary: 150 mg/dL — ABNORMAL HIGH (ref 70–99)
Glucose-Capillary: 135 mg/dL — ABNORMAL HIGH (ref 70–99)

## 2022-03-16 LAB — CBC
HCT: 40.7 % (ref 39.0–52.0)
Hemoglobin: 12.9 g/dL — ABNORMAL LOW (ref 13.0–17.0)
MCH: 32.4 pg (ref 26.0–34.0)
MCHC: 31.7 g/dL (ref 30.0–36.0)
MCV: 102.3 fL — ABNORMAL HIGH (ref 80.0–100.0)
Platelets: 282 10*3/uL (ref 150–400)
RBC: 3.98 MIL/uL — ABNORMAL LOW (ref 4.22–5.81)
RDW: 12.6 % (ref 11.5–15.5)
WBC: 20.4 10*3/uL — ABNORMAL HIGH (ref 4.0–10.5)
nRBC: 0 % (ref 0.0–0.2)

## 2022-03-16 LAB — TROPONIN I (HIGH SENSITIVITY): Troponin I (High Sensitivity): 11 ng/L (ref <18)

## 2022-03-16 LAB — HEPARIN LEVEL (UNFRACTIONATED): Heparin Unfractionated: 0.13 IU/mL — ABNORMAL LOW (ref 0.30–0.70)

## 2022-03-16 MED ORDER — MORPHINE SULFATE (CONCENTRATE) 20 MG/ML PO SOLN: 10.0000 mg | Freq: Three times a day (TID) | ORAL | 0 refills | Status: AC | PRN

## 2022-03-16 MED ORDER — PANTOPRAZOLE SODIUM 40 MG PO TBEC: 40.0000 mg | DELAYED_RELEASE_TABLET | Freq: Every day | ORAL | 1 refills | Status: AC

## 2022-03-16 MED ORDER — AMOXICILLIN-POT CLAVULANATE 600-42.9 MG/5ML PO SUSR
875.0000 mg | Freq: Two times a day (BID) | ORAL | Status: DC
  Administered 2022-03-16: 875 mg via ORAL
  Filled 2022-03-16 (×3): qty 7.3

## 2022-03-16 MED ORDER — DILTIAZEM HCL ER COATED BEADS 120 MG PO CP24: 120.0000 mg | ORAL_CAPSULE | Freq: Every day | ORAL | 1 refills | Status: AC

## 2022-03-16 MED ORDER — ACETAMINOPHEN 500 MG PO TABS: 500.0000 mg | ORAL_TABLET | Freq: Four times a day (QID) | ORAL | Status: AC | PRN

## 2022-03-16 MED ORDER — PANTOPRAZOLE SODIUM 40 MG PO TBEC
40.0000 mg | DELAYED_RELEASE_TABLET | Freq: Every day | ORAL | Status: DC
  Administered 2022-03-16: 40 mg via ORAL
  Filled 2022-03-16 (×2): qty 1

## 2022-03-16 MED ORDER — PREDNISONE 10 MG PO TABS: ORAL_TABLET | ORAL | 0 refills | Status: AC

## 2022-03-16 MED ORDER — DILTIAZEM HCL ER COATED BEADS 120 MG PO CP24
120.0000 mg | ORAL_CAPSULE | Freq: Every day | ORAL | Status: DC
  Administered 2022-03-16: 120 mg via ORAL
  Filled 2022-03-16: qty 1

## 2022-03-16 MED ORDER — DILTIAZEM HCL 25 MG/5ML IV SOLN
5.0000 mg | Freq: Once | INTRAVENOUS | Status: AC
  Administered 2022-03-16: 5 mg via INTRAVENOUS
  Filled 2022-03-16: qty 5

## 2022-03-16 MED ORDER — HEPARIN BOLUS VIA INFUSION
2500.0000 [IU] | Freq: Once | INTRAVENOUS | Status: AC
  Administered 2022-03-16: 2500 [IU] via INTRAVENOUS
  Filled 2022-03-16: qty 2500

## 2022-03-16 MED ORDER — HEPARIN (PORCINE) 25000 UT/250ML-% IV SOLN
800.0000 [IU]/h | INTRAVENOUS | Status: DC
  Administered 2022-03-16: 800 [IU]/h via INTRAVENOUS
  Filled 2022-03-16: qty 250

## 2022-03-16 MED ORDER — SODIUM CHLORIDE 0.9 % IV BOLUS
500.0000 mL | Freq: Once | INTRAVENOUS | Status: AC
  Administered 2022-03-16: 500 mL via INTRAVENOUS

## 2022-03-16 MED ORDER — SENNOSIDES 8.6 MG PO TABS: 2.0000 | ORAL_TABLET | Freq: Two times a day (BID) | ORAL | 0 refills | Status: AC

## 2022-03-16 MED ORDER — AMOXICILLIN-POT CLAVULANATE 600-42.9 MG/5ML PO SUSR: 875.0000 mg | Freq: Two times a day (BID) | ORAL | 0 refills | Status: AC

## 2022-03-16 NOTE — Progress Notes (Signed)
Pt anxious to go home and requesting to use his wife's oxygen concentrator at home until his DME can be delivered tomorrow. Pt's wife and son at bedside stating two working concentrators are available at the home as well as a portable tank he can use for transportation. Informed Dr. Dyann Kief and received confirmation to resume his discharge home with hospice.

## 2022-03-16 NOTE — Discharge Summary (Signed)
Physician Discharge Summary   Patient: Dylan Shea MRN: [id number] DOB: 04/23/1951  Admit date:     03/13/2022  Discharge date: 03/16/22  Discharge Physician: Barton Dubois   PCP: Neale Burly, MD   Recommendations at discharge:  Comfort care and symptomatic management Patient discharged home with hospice with intention to pursuit comfort feeding.  Discharge Diagnoses: Principal Problem:   Aspiration pneumonia ([place]) Active Problems:   Controlled type 2 diabetes mellitus without complication, without long-term current use of insulin (HCC)   ILD (interstitial lung disease) ([place])   Essential hypertension   Sepsis due to undetermined organism ([place])   Pneumothorax   Fall at home, initial encounter   Pneumomediastinum ([place])   Acute respiratory failure with hypoxia ([place])   Pneumonia of both lungs due to infectious organism   Postinflammatory pulmonary fibrosis [facility])  Hospital Course: 71 year old male with a history of pulmonary fibrosis/UIP, dysphagia with chronic aspiration, hypertension, diabetes mellitus type 2 not on medication, GERD, hyperlipidemia presenting with shortness of breath and a fall.  The patient states that he has been short of breath for the last 2 to 3 months but it has worsened in the past week.  The patient was feeling lightheaded yesterday and fell and hit his head.  There was no loss of consciousness.  He has been short of breath even before his fall.  He denied any fevers, chills, headache, nausea, vomiting, diarrhea, abdominal pain.  He does not have a significant cough. Upon arrival, the patient had low-grade temperature of 99.8 F.  He was tachycardic into 110-120.  Oxygen saturation was 89% on room air.  He was placed on 6 L.  CT of the chest showed a new small right pneumothorax 10%.  There was moderate pneumomediastinum increased from prior scans.  There is new patchy bilateral lower lobe airspace disease.  The patient was started on ceftriaxone and  azithromycin.  On the morning of 03/14/2022, the patient continued to be short of breath with saturation 92-93% on 6 L.  Pulmonary consult was placed.  The patient was moved to the stepdown unit and placed on nonrebreather.  Assessment and Plan: * Aspiration pneumonia ([place]) -Patient has a history of chronic aspiration with dysphagia -No fever, stable breathing with oxygen supplementation.  Arrange home oxygen and complete therapy using oral Augmentin. -After discussion with patient decision has been made to pursuit hospice and comfort care; dysphagia 1 diet for comfort feeding allowed. -Appreciate assistance by [facility] and palliative care. -Prescription for pain medication provided to assist with air hunger and comfort.  Postinflammatory pulmonary fibrosis (HCC) - Complete steroids management as recommended by pulmonology -Patient transition to hospice care and comfort.  Pneumonia of both lungs due to infectious organism - Complete treatment with Augmentin as mentioned above -Aspiration pneumonia very high in the differential.  Acute respiratory failure with hypoxia (HCC) -Presented with hypoxia 89% on room air and tachypnea Secondary to pneumonia, pneumothorax and pneumomediastinum. -Patient in the requiring the use of nonrebreather 100% oxygen supplementation and subsequent to that up to 15 L high flow nasal cannula. -Now with resolution of pneumothorax and treatment of pneumonia provided oxygen supplementation decreased to 6-7 L at time of discharge -Patient transition to full comfort care with symptomatic management and hospice.   Pneumomediastinum ([place]) -Secondary to the patient's pulmonary fibrosis -Pulmonary consulted; no other intervention recommended -Planning to transition to full comfort care and symptomatic management only. -Patient discharged home with hospice -Continue oxygen supplementation.   Fall at home, initial encounter -  Discharged home with home hospice and  family care.  Pneumothorax -10% pneumothorax noted on CT chest -After receiving treatment with 100% oxygen supplementation through nonrebreather patient pneumothorax is now resolved. -Continue to focus on symptomatic management and comfort care.   Sepsis due to undetermined organism [facility]) -Presented with leukocytosis, tachycardia, and respiratory failure Secondary to pneumonia -PCT 0.28 -Left calcified 1.5 -Complete oral antibiotics for aspiration pneumonia and focus on comfort care.  Essential hypertension -Patient has been placed on daily extended release Cardizem and to assist with blood pressure and heart rate control (transient episode of A-fib during hospitalization notice). -No other antihypertensive agents will be provided -Patient overall transition to comfort care with symptomatic management and home hospice.   ILD (interstitial lung disease) ([place]) -Patient has pulmonary fibrosis/UIP -Recently finished a clinical trial 01/06/2022 -Follows Dr. Chase Caller -Not currently on active treatment -Currently transition to comfort care with home hospice. -Continue symptomatic management. -Appreciate assistance and recommendation by pulmonology service.  Controlled type 2 diabetes mellitus without complication, without long-term current use of insulin (HCC) -Previously on insulin, but not currently on any agents presently 12/23/2021 hemoglobin A1c 6.1 -Patient has been transitioned to comfort care with home hospice.  Transient atrial fibrillation -Rate control with the use of Cardizem and -Not a candidate for blood thinner -Transition to full comfort and hospice.  Consultants: Pulmonology, palliative care, hospice Procedures performed: See below for x-ray reports. Disposition: Discharged home with hospice. Diet recommendation: Dysphagia 1 comfort feeding.   DISCHARGE MEDICATION: Allergies as of 03/16/2022   No Known Allergies      Medication List     STOP taking these  medications    atorvastatin 10 MG tablet Commonly known as: LIPITOR   clopidogrel 75 MG tablet Commonly known as: PLAVIX   insulin glargine 100 UNIT/ML injection Commonly known as: LANTUS   losartan 100 MG tablet Commonly known as: COZAAR   oxyCODONE-acetaminophen 5-325 MG tablet Commonly known as: PERCOCET/ROXICET       TAKE these medications    acetaminophen 500 MG tablet Commonly known as: TYLENOL Take 1 tablet (500 mg total) by mouth every 6 (six) hours as needed for mild pain or headache (or Fever >/= 101).   albuterol 108 (90 Base) MCG/ACT inhaler Commonly known as: VENTOLIN HFA Inhale 1-2 puffs into the lungs every 6 (six) hours as needed.   amoxicillin-clavulanate 600-42.9 MG/5ML suspension Commonly known as: AUGMENTIN Take 7.3 mLs (875 mg total) by mouth 2 (two) times daily for 4 days.   diltiazem 120 MG 24 hr capsule Commonly known as: CARDIZEM CD Take 1 capsule (120 mg total) by mouth daily.   levothyroxine 50 MCG tablet Commonly known as: SYNTHROID Take 50 mcg by mouth daily before breakfast.   morphine 20 MG/ML concentrated solution Commonly known as: ROXANOL Take 0.5 mLs (10 mg total) by mouth every 8 (eight) hours as needed for severe pain or shortness of breath. May give sublingually if needed.   pantoprazole 40 MG tablet Commonly known as: PROTONIX Take 1 tablet (40 mg total) by mouth daily.   predniSONE 10 MG tablet Commonly known as: DELTASONE Take 4 tablets by mouth daily x1 day; then 3 tablets by mouth daily x2 days; then 2 tablet by mouth daily x2 days; then 1 tablet by mouth daily x3 days and stop prednisone.   senna 8.6 MG tablet Commonly known as: Senokot Take 2 tablets (17.2 mg total) by mouth 2 (two) times daily. May crush, mix with water and give sublingually if needed.  Durable Medical Equipment  (From admission, onward)           Start     Ordered   03/16/22 1225  For home use only DME oxygen  Once        Question Answer Comment  Length of Need 6 Months   Mode or (Route) Nasal cannula   Liters per Minute 7   Frequency Continuous (stationary and portable oxygen unit needed)   Oxygen conserving device Yes   Oxygen delivery system Gas      03/16/22 1227            Discharge Exam: Filed Weights   03/13/22 1449 03/13/22 1823  Weight: 63.5 kg 57.5 kg   General exam: Alert, awake, oriented x 3; frail cachectic and in no major distress.  Good saturation on 6-7 L supplementation.  No fever. Respiratory system: No wheezing or crackles on exam; positive rhonchi bilaterally. Cardiovascular system: Rate controlled, no rubs, no gallops, no JVD on exam.   Gastrointestinal system: Abdomen is nondistended, soft and nontender. No organomegaly or masses felt. Normal bowel sounds heard. Central nervous system: Alert and oriented. No focal neurological deficits. Extremities: No cyanosis or clubbing.   Skin: No petechiae. Psychiatry: Judgement and insight appear normal.  For that affect.  Condition at discharge: Stable and in no major distress.  The results of significant diagnostics from this hospitalization (including imaging, microbiology, ancillary and laboratory) are listed below for reference.   Imaging Studies: DG CHEST PORT 1 VIEW  Result Date: 03/15/2022 CLINICAL DATA:  Hypoxia, aspiration pneumonia. History of pulmonary fibrosis. EXAM: PORTABLE CHEST 1 VIEW COMPARISON:  03/14/2022. FINDINGS: The heart size and mediastinal contours are stable. Mild pneumomediastinum is unchanged. Small loculated pleural effusion is noted on the right. The previously described pneumothorax is no longer seen. Interstitial prominence with scattered parenchymal opacities are noted in the lungs bilaterally which may be related to history of pulmonary fibrosis. Mild patchy airspace disease is noted at the lung bases and unchanged from the prior exam. Subcutaneous emphysema is noted in the right axilla. No acute  osseous abnormality. IMPRESSION: 1. Interval resolution of right pneumothorax. 2. Small loculated right pleural effusion. 3. Subcutaneous emphysema in the right axilla, new from the previous exam. 4. Stable pneumomediastinum. 5. Patchy airspace disease at the lung bases, not significantly changed from the prior exam. 6. Chronic interstitial lung disease with pulmonary fibrosis. Electronically Signed   By: Brett Fairy M.D.   On: 03/15/2022 23:02   DG CHEST PORT 1 VIEW  Result Date: 03/14/2022 CLINICAL DATA:  Right pneumothorax EXAM: PORTABLE CHEST 1 VIEW COMPARISON:  Previous studies including the CT chest done on 03/13/2022 FINDINGS: Cardiac size is within normal limits. Diffuse increase in interstitial markings suggest chronic interstitial lung disease and pulmonary fibrosis. There is small right apical pneumothorax measuring up to 2 cm in thickness. This finding has not changed significantly in comparison with the CT done on 03/13/2022. There are linear lucencies in mediastinum consistent with pneumomediastinum. Right lung remains well expanded. Small right pleural effusion is seen. IMPRESSION: Small right apical pneumothorax. Pneumomediastinum. Small right pleural effusion. Chronic interstitial lung disease with pulmonary fibrosis. Possibility of superimposed infection is not excluded. Electronically Signed   By: Elmer Picker M.D.   On: 03/14/2022 08:46   CT Chest Wo Contrast  Result Date: 03/13/2022 CLINICAL DATA:  Interstitial lung disease and pulmonary fibrosis. Hypoxia. Evaluate for aspiration pneumonia. EXAM: CT CHEST WITHOUT CONTRAST TECHNIQUE: Multidetector CT imaging of the chest was performed  following the standard protocol without IV contrast. RADIATION DOSE REDUCTION: This exam was performed according to the departmental dose-optimization program which includes automated exposure control, adjustment of the mA and/or kV according to patient size and/or use of iterative reconstruction  technique. COMPARISON:  High-resolution chest CT 12/29/2021 FINDINGS: Cardiovascular: No significant vascular findings. Normal heart size. No pericardial effusion. There are atherosclerotic calcifications of the aorta and coronary arteries. Mediastinum/Nodes: There is moderate amount of diffuse pneumomediastinum which has increased from prior. Visualized esophagus and thyroid gland are within normal limits. There is an enlarged subcarinal lymph node measuring 16 mm short axis which appears unchanged. Additional nonenlarged mediastinal lymph nodes appears similar to prior. Lungs/Pleura: There is a new small right pneumothorax (approximately 10%). There is no mediastinal shift. Peripheral interstitial and fibrotic changes are again noted similar to the prior study. There is some new patchy airspace disease in the bilateral lower lobes peripherally. There is no pleural effusion. The trachea and central airways appear patent. Upper Abdomen: No acute abnormality. Musculoskeletal: No chest wall mass or suspicious bone lesions identified. Degenerative changes affect the spine. IMPRESSION: 1. New small 10% right-sided pneumothorax. 2. Moderate pneumomediastinum has increased from prior. 3. New small amount of patchy airspace disease in the bilateral lower lobes worrisome for infection. 4. Otherwise stable fibrotic interstitial lung disease. Electronically Signed   By: Ronney Asters M.D.   On: 03/13/2022 16:31   CT Head Wo Contrast  Result Date: 03/13/2022 CLINICAL DATA:  Head trauma, minor (Age >= 65y) fall head injury hematoma left parietal scalp EXAM: CT HEAD WITHOUT CONTRAST TECHNIQUE: Contiguous axial images were obtained from the base of the skull through the vertex without intravenous contrast. RADIATION DOSE REDUCTION: This exam was performed according to the departmental dose-optimization program which includes automated exposure control, adjustment of the mA and/or kV according to patient size and/or use of  iterative reconstruction technique. COMPARISON:  None Available. FINDINGS: Brain: There is no acute intracranial hemorrhage, mass effect, or edema. Gray-white differentiation is preserved. There is no extra-axial fluid collection. Prominence of the ventricles and sulci reflects minor parenchymal volume loss. Patchy hypoattenuation in the supratentorial white matter is nonspecific but may reflect mild chronic microvascular ischemic changes. Vascular: There is atherosclerotic calcification at the skull base. Skull: Calvarium is unremarkable. Sinuses/Orbits: No acute finding. Other: Mastoid air cells are clear. Left frontal scalp soft tissue swelling. IMPRESSION: No evidence of acute intracranial injury. Electronically Signed   By: Macy Mis M.D.   On: 03/13/2022 16:26   ECHOCARDIOGRAM COMPLETE  Result Date: 02/27/2022    ECHOCARDIOGRAM REPORT   Patient Name:   Dylan Shea Date of Exam: 02/27/2022 Medical Rec #:  [id number]        Height:       73.0 in Accession #:    [id number]       Weight:       150.0 lb Date of Birth:  10/30/1950         BSA:          1.904 m Patient Age:    26 years         BP:           109/77 mmHg Patient Gender: M                HR:           117 bpm. Exam Location:  [place] Procedure: 2D Echo, Cardiac Doppler and Color Doppler Indications:    R06.00 Dyspnea  History:  Patient has no prior history of Echocardiogram examinations.                 Risk Factors:Hypertension, Diabetes and Dyslipidemia. Idiopathic                 pulmonary fibrosis. Dyspnea on exertion. Interstitial lung                 disease.  Sonographer:    Diamond Nickel RCS Referring Phys: [employer]  1. Left ventricular ejection fraction, by estimation, is 60 to 65%. The left ventricle has normal function. The left ventricle has no regional wall motion abnormalities. There is mild left ventricular hypertrophy. Left ventricular diastolic parameters are consistent with Grade I  diastolic dysfunction (impaired relaxation).  2. Right ventricular systolic function is normal. The right ventricular size is normal. There is mildly elevated pulmonary artery systolic pressure.  3. The mitral valve is normal in structure. No evidence of mitral valve regurgitation.  4. The aortic valve is tricuspid. Aortic valve regurgitation is not visualized. Aortic valve sclerosis is present, with no evidence of aortic valve stenosis. FINDINGS  Left Ventricle: Left ventricular ejection fraction, by estimation, is 60 to 65%. The left ventricle has normal function. The left ventricle has no regional wall motion abnormalities. The left ventricular internal cavity size was small. There is mild left ventricular hypertrophy. Left ventricular diastolic parameters are consistent with Grade I diastolic dysfunction (impaired relaxation). Right Ventricle: The right ventricular size is normal. No increase in right ventricular wall thickness. Right ventricular systolic function is normal. There is mildly elevated pulmonary artery systolic pressure. The tricuspid regurgitant velocity is 3.03  m/s, and with an assumed right atrial pressure of 3 mmHg, the estimated right ventricular systolic pressure is 43.3 mmHg. Left Atrium: Left atrial size was normal in size. Right Atrium: Right atrial size was normal in size. Pericardium: There is no evidence of pericardial effusion. Mitral Valve: The mitral valve is normal in structure. No evidence of mitral valve regurgitation. Tricuspid Valve: The tricuspid valve is normal in structure. Tricuspid valve regurgitation is mild. Aortic Valve: The aortic valve is tricuspid. Aortic valve regurgitation is not visualized. Aortic valve sclerosis is present, with no evidence of aortic valve stenosis. Pulmonic Valve: The pulmonic valve was not well visualized. Pulmonic valve regurgitation is trivial. Aorta: The aortic root and ascending aorta are structurally normal, with no evidence of dilitation.  IAS/Shunts: The interatrial septum was not well visualized.  LEFT VENTRICLE PLAX 2D LVIDd:         2.50 cm   Diastology LVIDs:         2.10 cm   LV e' medial:    6.42 cm/s LV PW:         1.30 cm   LV E/e' medial:  9.1 LV IVS:        1.30 cm   LV e' lateral:   5.77 cm/s LVOT diam:     2.30 cm   LV E/e' lateral: 10.2 LV SV:         50 LV SV Index:   26 LVOT Area:     4.15 cm  RIGHT VENTRICLE RV Basal diam:  2.80 cm RV S prime:     14.60 cm/s TAPSE (M-mode): 1.5 cm RVSP:           39.7 mmHg LEFT ATRIUM           Index       RIGHT ATRIUM  Index LA diam:      1.00 cm 0.53 cm/m  RA Pressure: 3.00 mmHg LA Vol (A2C): 14.5 ml 7.62 ml/m  RA Area:     10.50 cm LA Vol (A4C): 13.8 ml 7.25 ml/m  RA Volume:   24.40 ml  12.82 ml/m  AORTIC VALVE LVOT Vmax:   84.60 cm/s LVOT Vmean:  53.900 cm/s LVOT VTI:    0.121 m  AORTA Ao Root diam: 3.90 cm Ao Asc diam:  3.10 cm MITRAL VALVE                TRICUSPID VALVE MV Area (PHT): 4.36 cm     TR Peak grad:   36.7 mmHg MV Decel Time: 174 msec     TR Vmax:        303.00 cm/s MV E velocity: 58.70 cm/s   Estimated RAP:  3.00 mmHg MV A velocity: 125.00 cm/s  RVSP:           39.7 mmHg MV E/A ratio:  0.47                             SHUNTS                             Systemic VTI:  0.12 m                             Systemic Diam: 2.30 cm Oswaldo Milian MD Electronically signed by Oswaldo Milian MD Signature Date/Time: 02/27/2022/12:55:22 PM    Final     Microbiology: Results for orders placed or performed during the hospital encounter of 03/13/22  SARS Coronavirus 2 by RT PCR (hospital order, performed in [facility] hospital lab) *cepheid single result test* Anterior Nasal Swab     Status: None   Collection Time: 03/13/22  3:10 PM   Specimen: Anterior Nasal Swab  Result Value Ref Range Status   SARS Coronavirus 2 by RT PCR NEGATIVE NEGATIVE Final    Comment: (NOTE) SARS-CoV-2 target nucleic acids are NOT DETECTED.  The SARS-CoV-2 RNA is generally detectable  in upper and lower respiratory specimens during the acute phase of infection. The lowest concentration of SARS-CoV-2 viral copies this assay can detect is 250 copies / mL. A negative result does not preclude SARS-CoV-2 infection and should not be used as the sole basis for treatment or other patient management decisions.  A negative result may occur with improper specimen collection / handling, submission of specimen other than nasopharyngeal swab, presence of viral mutation(s) within the areas targeted by this assay, and inadequate number of viral copies (<250 copies / mL). A negative result must be combined with clinical observations, patient history, and epidemiological information.  Fact Sheet for Patients:   https://www.patel.info/  Fact Sheet for Healthcare Providers: https://hall.com/  This test is not yet approved or  cleared by the [place] FDA and has been authorized for detection and/or diagnosis of SARS-CoV-2 by FDA under an Emergency Use Authorization (EUA).  This EUA will remain in effect (meaning this test can be used) for the duration of the COVID-19 declaration under Section 564(b)(1) of the Act, 21 U.S.C. section 360bbb-3(b)(1), unless the authorization is terminated or revoked sooner.  Performed at [facility], [address], [place], [place] [address]   Culture, blood (single) w Reflex to ID Panel  Status: None (Preliminary result)   Collection Time: 03/13/22  8:15 PM   Specimen: BLOOD RIGHT HAND  Result Value Ref Range Status   Specimen Description BLOOD RIGHT HAND  Final   Special Requests   Final    BOTTLES DRAWN AEROBIC ONLY Blood Culture adequate volume   Culture   Final    NO GROWTH 2 DAYS Performed at [facility], [address], [place], [place] [address]    Report Status PENDING  Incomplete  Culture, blood (Routine X 2) w Reflex to ID Panel     Status: None (Preliminary result)   Collection Time:  03/13/22  8:15 PM   Specimen: BLOOD RIGHT ARM  Result Value Ref Range Status   Specimen Description BLOOD RIGHT ARM  Final   Special Requests   Final    BOTTLES DRAWN AEROBIC AND ANAEROBIC Blood Culture adequate volume   Culture   Final    NO GROWTH 2 DAYS Performed at [facility], [address] [place], [place] [address]    Report Status PENDING  Incomplete  Culture, blood (Routine X 2) w Reflex to ID Panel     Status: None (Preliminary result)   Collection Time: 03/13/22  8:16 PM   Specimen: BLOOD LEFT WRIST  Result Value Ref Range Status   Specimen Description BLOOD LEFT WRIST  Final   Special Requests   Final    BOTTLES DRAWN AEROBIC AND ANAEROBIC Blood Culture adequate volume   Culture   Final    NO GROWTH 2 DAYS Performed at [facility], [address], [place], [place] [address]    Report Status PENDING  Incomplete  MRSA Next Gen by PCR, Nasal     Status: None   Collection Time: 03/14/22  9:01 AM   Specimen: Nasal Mucosa; Nasal Swab  Result Value Ref Range Status   MRSA by PCR Next Gen NOT DETECTED NOT DETECTED Final    Comment: (NOTE) The GeneXpert MRSA Assay (FDA approved for NASAL specimens only), is one component of a comprehensive MRSA colonization surveillance program. It is not intended to diagnose MRSA infection nor to guide or monitor treatment for MRSA infections. Test performance is not FDA approved in patients less than 4 years old. Performed at [facility], [address] [place], [place] [address]     Labs: CBC: Recent Labs  Lab 03/13/22 1508 03/13/22 2015 03/15/22 0456 03/16/22 0425  WBC 15.2* 17.5* 23.0* 20.4*  HGB 13.5 12.2* 13.0 12.9*  HCT 41.5 37.0* 40.4 40.7  MCV 98.6 97.9 100.0 102.3*  PLT 369 330 297 332   Basic Metabolic Panel: Recent Labs  Lab 03/13/22 1508 03/13/22 2015 03/15/22 0456  NA 137  --  139  K 5.1  --  4.1  CL 98  --  101  CO2 27  --  32  GLUCOSE 140*  --  167*  BUN 18  --  26*  CREATININE 0.98 0.99 0.74   CALCIUM 9.0  --  8.7*  MG  --  1.8 2.1   Liver Function Tests: No results for input(s): "AST", "ALT", "ALKPHOS", "BILITOT", "PROT", "ALBUMIN" in the last 168 hours. CBG: Recent Labs  Lab 03/15/22 1103 03/15/22 1608 03/15/22 2139 03/16/22 0814 03/16/22 1119  GLUCAP 128* 125* 106* 124* 150*    Discharge time spent: greater than 30 minutes.  Signed: Barton Dubois, MD [facility] 03/16/2022

## 2022-03-16 NOTE — Progress Notes (Signed)
Pt discharged with all personal belongings. Wearing 5L/[place] via full, personal O2 tank. Resp even and non labored. Verbalized understanding of all discharge instructions.

## 2022-03-16 NOTE — Telephone Encounter (Signed)
See telephone note from 03/01/22

## 2022-03-16 NOTE — Assessment & Plan Note (Signed)
-   Complete treatment with Augmentin as mentioned above -Aspiration pneumonia very high in the differential.

## 2022-03-16 NOTE — Assessment & Plan Note (Signed)
-   Complete steroids management as recommended by pulmonology -Patient transition to hospice care and comfort.

## 2022-03-16 NOTE — Progress Notes (Signed)
Palliative: Dylan Shea is to discharge home with the benefits of hospice care through hospice of [facility].  Conference with bedside nursing staff and transition of care team related to patient condition, needs, goals of care, disposition. DNR/goldenrod form completed and placed on chart.    No charge Dylan Axe, NP Palliative medicine team Team phone [phone] Greater than 50% of this time was spent counseling and coordinating care related to the above assessment and plan.

## 2022-03-16 NOTE — TOC Progression Note (Signed)
Transition of Care [facility]) - Progression Note    Patient Details  Name: [name] MRN: [id number] Date of Birth: June 09, 1951  [facility] CM/SW Contact  Ihor Gully, LCSW Phone Number: 03/16/2022, 2:53 PM  Clinical Narrative:    Hospice uses [facility] for DME, they cannot deliver o2 until tomorrow between 10a.m.-1p.m. Patient unable to d/c today.      Barriers to Discharge: Continued Medical Work up  Expected Discharge Plan and Services           Expected Discharge Date: 03/16/22                                     Social Determinants of Health (SDOH) Interventions Housing Interventions: Intervention Not Indicated  Readmission Risk Interventions     No data to display

## 2022-03-16 NOTE — Progress Notes (Signed)
NAME:  Dylan Shea, MRN:  [id number], DOB:  Jun 01, 1951, LOS: 2 ADMISSION DATE:  03/13/2022, CONSULTATION DATE:  9/12 REFERRING MD:  Tat/[facility]ad, CHIEF COMPLAINT:  PF/ ptx   History of Present Illness:  78 yowm never smoker  with medical history significant of interstitial lung disease but not oxygen dependent, hypertension, diabetes, hyperlipidemia, GERD, dysphagia presented to ED with a complaint of fall and shortness of breath.  Apparently patient was in his bathroom 9/11 when he felt lightheaded and he fell and hit his head to the ground.  There is some bruise on his left head but no laceration.  Patient did not lose consciousness.  He denied having any chest pain, palpitation, headache, fever, chills, sweating, cough, any abdominal pain, any problem with urination or with bowel movements however he had been feeling short of breath for about a month  progressed over the  last week PTA.     Previous dysphagia w/u at [facility] Feeding tube as only option per fm and he refused.    ED Course: Upon arrival to ED, he was tachycardic, tachypneic and has leukocytosis.  CT head unremarkable.  CT chest showed new small 10% right-sided pneumothorax and bilateral patchy opacities in the bases.  Patient was diagnosed with possible aspiration pneumonia.  He received Zosyn in the ED.  ED physician discussed with Dr. Melvyn Novas of pulmonology about his pneumothorax and he recommended conservative/oxygen therapy at the moment and no chest tube.  Hospitalist were consulted for admission.  COVID has been tested at negative      Scheduled Meds:  amoxicillin-clavulanate  875 mg Oral BID   Chlorhexidine Gluconate Cloth  6 each Topical Daily   diltiazem  120 mg Oral Daily   feeding supplement  237 mL Oral BID BM   influenza vaccine adjuvanted  0.5 mL Intramuscular Tomorrow-1000   insulin aspart  0-5 Units Subcutaneous QHS   insulin aspart  0-9 Units Subcutaneous TID WC   levalbuterol  0.63 mg Nebulization Q6H    levothyroxine  50 mcg Oral QAC breakfast   mouth rinse  15 mL Mouth Rinse 4 times per day   pantoprazole  40 mg Oral Daily   pneumococcal 20-valent conjugate vaccine  0.5 mL Intramuscular Tomorrow-1000   Continuous Infusions:   PRN Meds:.acetaminophen **OR** acetaminophen, HYDROmorphone (DILAUDID) injection, ondansetron **OR** ondansetron (ZOFRAN) IV, mouth rinse    Significant Hospital Events: Including procedures, antibiotic start and stop dates in addition to other pertinent events   Palliative care eval 9/13   Interim History / Subjective:  Starting to eat on compassionate basis/ gurglng cough    Objective   Blood pressure (!) 141/74, pulse 99, temperature (!) 96.7 F (35.9 C), temperature source Axillary, resp. rate (!) 26, height '6\' 1"'$  (1.854 m), weight 57.5 kg, SpO2 94 %.    FiO2 (%):  [100 %] 100 %   Intake/Output Summary (Last 24 hours) at 03/16/2022 1249 Last data filed at 03/16/2022 0426 Gross per 24 hour  Intake 536.88 ml  Output --  Net 536.88 ml   Filed Weights   03/13/22 1449 03/13/22 1823  Weight: 63.5 kg 57.5 kg    Examination:  Tmax:  99.8 General appearance:    very frail acutely ill appearing cachectic wm nad at rest  At Rest 02 sats  98% on HFNC x 7 lpm No jvd Oropharynx clear,  mucosa nl Neck supple Lungs with insp crackles bilaterally IRIR no s3 or or sign murmur Abd soft/nl  excursion  Extr warm  with no edema or clubbing noted Neuro  Sensorium intact,  no apparent motor deficits    I personally reviewed images and agree with radiology impression as follows:  CXR:   portable 9/13 1. Interval resolution of right pneumothorax. 2. Small loculated right pleural effusion. 3. Subcutaneous emphysema in the right axilla, new from the previous exam. 4. Stable pneumomediastinum. 5. Patchy airspace disease at the lung bases, not significantly changed from the prior exam. 6. Chronic interstitial lung disease with pulmonary fibrosis   Assessment &  Plan:  1) small R apical PTX superimposed on severe ILD, not prev 02 dep and now 02 dep resp failure >>> rx 100% NRM since this will help with absortion by lowering the partial pressure of venous Nitrogen and increasing the partial pressure of 02 of any additional 02 thru the leak into the pleural space. >>>> cxr 9/13 no PTX so ok to use NP titrated to sats > 94%  2) chronic dysphagia > compassionate feeding per palliative care /DNR   Pulmonary f/u can be prn in this setting      Best Practice (right click and "Reselect all SmartList Selections" daily)   Per [facility]   Labs   CBC: Recent Labs  Lab 03/13/22 1508 03/13/22 2015 03/15/22 0456 03/16/22 0425  WBC 15.2* 17.5* 23.0* 20.4*  HGB 13.5 12.2* 13.0 12.9*  HCT 41.5 37.0* 40.4 40.7  MCV 98.6 97.9 100.0 102.3*  PLT 369 330 297 620    Basic Metabolic Panel: Recent Labs  Lab 03/13/22 1508 03/13/22 2015 03/15/22 0456  NA 137  --  139  K 5.1  --  4.1  CL 98  --  101  CO2 27  --  32  GLUCOSE 140*  --  167*  BUN 18  --  26*  CREATININE 0.98 0.99 0.74  CALCIUM 9.0  --  8.7*  MG  --  1.8 2.1   GFR: Estimated Creatinine Clearance: 68.9 mL/min (by C-G formula based on SCr of 0.74 mg/dL). Recent Labs  Lab 03/13/22 1508 03/13/22 2015 03/13/22 2155 03/14/22 0402 03/15/22 0456 03/16/22 0425  PROCALCITON  --  0.28  --  0.38  --   --   WBC 15.2* 17.5*  --   --  23.0* 20.4*  LATICACIDVEN  --  1.5 0.9  --   --   --     Liver Function Tests: No results for input(s): "AST", "ALT", "ALKPHOS", "BILITOT", "PROT", "ALBUMIN" in the last 168 hours. No results for input(s): "LIPASE", "AMYLASE" in the last 168 hours. No results for input(s): "AMMONIA" in the last 168 hours.  ABG    Component Value Date/Time   HCO3 34.0 (H) 03/16/2022 0424   O2SAT 87.9 03/16/2022 0424     Coagulation Profile: Recent Labs  Lab 03/14/22 0402  INR 1.2    Cardiac Enzymes: No results for input(s): "CKTOTAL", "CKMB", "CKMBINDEX",  "TROPONINI" in the last 168 hours.  HbA1C: Hgb A1c MFr Bld  Date/Time Value Ref Range Status  03/13/2022 08:15 PM 5.5 4.8 - 5.6 % Final    Comment:    (NOTE) Pre diabetes:          5.7%-6.4%  Diabetes:              >6.4%  Glycemic control for   <7.0% adults with diabetes   12/23/2021 12:26 PM 6.1 4.6 - 6.5 % Final    Comment:    Glycemic Control Guidelines for People with Diabetes:Non Diabetic:  <6%Goal of Therapy: <7%Additional  Action Suggested:  >8%     CBG: Recent Labs  Lab 03/15/22 1103 03/15/22 1608 03/15/22 2139 03/16/22 0814 03/16/22 1119  GLUCAP 128* 125* 106* 124* 150*      Christinia Gully, MD Pulmonary and [place] [phone]   After 7:00 pm call Elink  [phone]

## 2022-03-16 NOTE — Progress Notes (Signed)
New onset A-fib with RVR overnight.  Heparin started.  Low-dose metoprolol initially attempted but did not affect heart rate.  Low-dose Cardizem ordered at this time.

## 2022-03-16 NOTE — Progress Notes (Signed)
ANTICOAGULATION CONSULT NOTE  Pharmacy Consult for heparin Indication: atrial fibrillation  No Known Allergies  Patient Measurements: Height: '6\' 1"'$  (185.4 cm) Weight: 57.5 kg (126 lb 12.2 oz) IBW/kg (Calculated) : 79.9 Heparin Dosing Weight: 57kg  Vital Signs: BP: 91/51 (09/14 0204) Pulse Rate: 120 (09/14 0204)  Labs: Recent Labs    03/13/22 1508 03/13/22 2015 03/14/22 0402 03/15/22 0456 03/15/22 2223 03/16/22 0035  HGB 13.5 12.2*  --  13.0  --   --   HCT 41.5 37.0*  --  40.4  --   --   PLT 369 330  --  297  --   --   LABPROT  --   --  15.3*  --   --   --   INR  --   --  1.2  --   --   --   CREATININE 0.98 0.99  --  0.74  --   --   TROPONINIHS  --   --   --   --  6 11    Estimated Creatinine Clearance: 68.9 mL/min (by C-G formula based on SCr of 0.74 mg/dL).   Medical History: Past Medical History:  Diagnosis Date   Diabetes ([place]) 01/10/2017   Dyspnea    GERD (gastroesophageal reflux disease)    Hypercholesteremia    Hypertension    Pulmonary fibrosis ([place]) 07/2021     Assessment: 38 yoM admitted with PNA now with AF RVR. Pharmacy to dose heparin, no AC PTA, LMWH ppx giveb 9/13 pm. CBC wnl 9/13.  Goal of Therapy:  Heparin level 0.3-0.7 units/ml Monitor platelets by anticoagulation protocol: Yes   Plan:  -Heparin 2500 units x1 -Heparin 800 units/hr -Check 6-hr heparin level -Monitor heparin level, CBC, S/Sx bleeding daily  Arrie Senate, PharmD, BCPS, [facility] Clinical Pharmacist Please check AMION for all [place] numbers 03/16/2022

## 2022-03-16 NOTE — Care Management Important Message (Signed)
Important Message  Patient Details  Name: Dylan Shea MRN: [id number] Date of Birth: February 17, 1951   Medicare Important Message Given:  N/A - LOS <3 / Initial given by admissions     Tommy Medal 03/16/2022, 2:47 PM

## 2022-03-17 ENCOUNTER — Ambulatory Visit: Payer: Medicare Other

## 2022-03-18 LAB — CULTURE, BLOOD (ROUTINE X 2)
Culture: NO GROWTH
Culture: NO GROWTH
Special Requests: ADEQUATE
Special Requests: ADEQUATE

## 2022-03-18 LAB — CULTURE, BLOOD (SINGLE)
Culture: NO GROWTH
Special Requests: ADEQUATE

## 2022-03-28 ENCOUNTER — Ambulatory Visit: Payer: Medicare Other | Admitting: Internal Medicine
